# Patient Record
Sex: Female | Born: 1955 | Race: White | Hispanic: No | Smoking: Former smoker
Health system: Southern US, Community
[De-identification: ages and names within clinical notes are randomized; demographics above are authoritative.]

## PROBLEM LIST (undated history)

## (undated) DIAGNOSIS — F419 Anxiety disorder, unspecified: Secondary | ICD-10-CM

## (undated) DIAGNOSIS — M1990 Unspecified osteoarthritis, unspecified site: Secondary | ICD-10-CM

## (undated) DIAGNOSIS — D229 Melanocytic nevi, unspecified: Secondary | ICD-10-CM

## (undated) DIAGNOSIS — G4700 Insomnia, unspecified: Secondary | ICD-10-CM

## (undated) DIAGNOSIS — D099 Carcinoma in situ, unspecified: Secondary | ICD-10-CM

## (undated) DIAGNOSIS — D049 Carcinoma in situ of skin, unspecified: Secondary | ICD-10-CM

## (undated) HISTORY — DX: Unspecified osteoarthritis, unspecified site: M19.90

## (undated) HISTORY — DX: Anxiety disorder, unspecified: F41.9

## (undated) HISTORY — DX: Melanocytic nevi, unspecified: D22.9

## (undated) HISTORY — DX: Insomnia, unspecified: G47.00

## (undated) HISTORY — DX: Carcinoma in situ, unspecified: D09.9

## (undated) HISTORY — PX: TUBAL LIGATION: SHX77

## (undated) HISTORY — DX: Carcinoma in situ of skin, unspecified: D04.9

---

## 2001-01-18 DIAGNOSIS — D049 Carcinoma in situ of skin, unspecified: Secondary | ICD-10-CM

## 2001-01-18 DIAGNOSIS — D229 Melanocytic nevi, unspecified: Secondary | ICD-10-CM

## 2001-01-18 HISTORY — DX: Melanocytic nevi, unspecified: D22.9

## 2001-01-18 HISTORY — DX: Carcinoma in situ of skin, unspecified: D04.9

## 2005-12-21 ENCOUNTER — Other Ambulatory Visit: Admission: RE | Admit: 2005-12-21 | Discharge: 2005-12-21 | Payer: Self-pay | Admitting: Gynecology

## 2007-01-10 ENCOUNTER — Other Ambulatory Visit: Admission: RE | Admit: 2007-01-10 | Discharge: 2007-01-10 | Payer: Self-pay | Admitting: Gynecology

## 2007-01-18 ENCOUNTER — Other Ambulatory Visit: Admission: RE | Admit: 2007-01-18 | Discharge: 2007-01-18 | Payer: Self-pay | Admitting: Gynecology

## 2007-02-06 ENCOUNTER — Other Ambulatory Visit: Admission: RE | Admit: 2007-02-06 | Discharge: 2007-02-06 | Payer: Self-pay | Admitting: Gynecology

## 2008-01-13 ENCOUNTER — Other Ambulatory Visit: Admission: RE | Admit: 2008-01-13 | Discharge: 2008-01-13 | Payer: Self-pay | Admitting: Gynecology

## 2008-01-13 ENCOUNTER — Encounter: Payer: Self-pay | Admitting: Women's Health

## 2008-01-13 ENCOUNTER — Ambulatory Visit: Payer: Self-pay | Admitting: Women's Health

## 2012-02-21 HISTORY — PX: BREAST SURGERY: SHX581

## 2012-08-07 ENCOUNTER — Encounter: Payer: Self-pay | Admitting: General Practice

## 2012-08-07 ENCOUNTER — Ambulatory Visit (INDEPENDENT_AMBULATORY_CARE_PROVIDER_SITE_OTHER): Payer: BC Managed Care – PPO | Admitting: General Practice

## 2012-08-07 VITALS — BP 109/69 | HR 72 | Temp 97.7°F | Ht 68.0 in | Wt 127.0 lb

## 2012-08-07 DIAGNOSIS — N390 Urinary tract infection, site not specified: Secondary | ICD-10-CM

## 2012-08-07 DIAGNOSIS — N760 Acute vaginitis: Secondary | ICD-10-CM

## 2012-08-07 DIAGNOSIS — A499 Bacterial infection, unspecified: Secondary | ICD-10-CM

## 2012-08-07 DIAGNOSIS — B9689 Other specified bacterial agents as the cause of diseases classified elsewhere: Secondary | ICD-10-CM

## 2012-08-07 DIAGNOSIS — N898 Other specified noninflammatory disorders of vagina: Secondary | ICD-10-CM

## 2012-08-07 LAB — POCT URINALYSIS DIPSTICK
Bilirubin, UA: NEGATIVE
Glucose, UA: NEGATIVE
Nitrite, UA: NEGATIVE
Protein, UA: NEGATIVE
Spec Grav, UA: 1.02
Urobilinogen, UA: NEGATIVE
pH, UA: 5

## 2012-08-07 LAB — POCT UA - MICROSCOPIC ONLY
Casts, Ur, LPF, POC: NEGATIVE
Crystals, Ur, HPF, POC: NEGATIVE
Yeast, UA: NEGATIVE

## 2012-08-07 LAB — POCT WET PREP WITH KOH
Bacteria Wet Prep HPF POC: NEGATIVE
Trichomonas, UA: NEGATIVE
Yeast Wet Prep HPF POC: NEGATIVE

## 2012-08-07 MED ORDER — CIPROFLOXACIN HCL 500 MG PO TABS: 500.0000 mg | ORAL_TABLET | Freq: Two times a day (BID) | ORAL | Status: DC

## 2012-08-07 MED ORDER — FLUCONAZOLE 150 MG PO TABS: 150.0000 mg | ORAL_TABLET | Freq: Once | ORAL | Status: DC

## 2012-08-07 NOTE — Patient Instructions (Addendum)
Urinary Tract Infection  Urinary tract infections (UTIs) can develop anywhere along your urinary tract. Your urinary tract is your body's drainage system for removing wastes and extra water. Your urinary tract includes two kidneys, two ureters, a bladder, and a urethra. Your kidneys are a pair of bean-shaped organs. Each kidney is about the size of your fist. They are located below your ribs, one on each side of your spine.  CAUSES  Infections are caused by microbes, which are microscopic organisms, including fungi, viruses, and bacteria. These organisms are so small that they can only be seen through a microscope. Bacteria are the microbes that most commonly cause UTIs.  SYMPTOMS   Symptoms of UTIs may vary by age and gender of the patient and by the location of the infection. Symptoms in young women typically include a frequent and intense urge to urinate and a painful, burning feeling in the bladder or urethra during urination. Older women and men are more likely to be tired, shaky, and weak and have muscle aches and abdominal pain. A fever may mean the infection is in your kidneys. Other symptoms of a kidney infection include pain in your back or sides below the ribs, nausea, and vomiting.  DIAGNOSIS  To diagnose a UTI, your caregiver will ask you about your symptoms. Your caregiver also will ask to provide a urine sample. The urine sample will be tested for bacteria and white blood cells. White blood cells are made by your body to help fight infection.  TREATMENT   Typically, UTIs can be treated with medication. Because most UTIs are caused by a bacterial infection, they usually can be treated with the use of antibiotics. The choice of antibiotic and length of treatment depend on your symptoms and the type of bacteria causing your infection.  HOME CARE INSTRUCTIONS   If you were prescribed antibiotics, take them exactly as your caregiver instructs you. Finish the medication even if you feel better after you  have only taken some of the medication.   Drink enough water and fluids to keep your urine clear or pale yellow.   Avoid caffeine, tea, and carbonated beverages. They tend to irritate your bladder.   Empty your bladder often. Avoid holding urine for long periods of time.   Empty your bladder before and after sexual intercourse.   After a bowel movement, women should cleanse from front to back. Use each tissue only once.  SEEK MEDICAL CARE IF:    You have back pain.   You develop a fever.   Your symptoms do not begin to resolve within 3 days.  SEEK IMMEDIATE MEDICAL CARE IF:    You have severe back pain or lower abdominal pain.   You develop chills.   You have nausea or vomiting.   You have continued burning or discomfort with urination.  MAKE SURE YOU:    Understand these instructions.   Will watch your condition.   Will get help right away if you are not doing well or get worse.  Document Released: 11/30/2004 Document Revised: 08/22/2011 Document Reviewed: 03/31/2011  [employer] Patient Information 2014 [employer].

## 2012-08-07 NOTE — Progress Notes (Signed)
Subjective:    Patient ID: Elizabeth Nash, female    DOB: [birth year], 57 y.o.   MRN: [id number]  Urinary Tract Infection  This is a new problem. The current episode started in the past 7 days. The problem occurs intermittently. The problem has been gradually worsening. The quality of the pain is described as aching and burning. The pain is at a severity of 5/10. There has been no fever. She is sexually active. There is no history of pyelonephritis. Associated symptoms include flank pain, frequency and urgency. Pertinent negatives include no chills, hematuria or nausea. She has tried nothing for the symptoms. Her past medical history is significant for recurrent UTIs.  Reports having a new partner (dating three months) and has noticed a vaginal discharge. Yellowish brown discharge with light "fishy" odor. Reports having unprotected sex.    Review of Systems  Constitutional: Negative for fever and chills.  Respiratory: Negative for cough, chest tightness and shortness of breath.   Cardiovascular: Negative for chest pain and palpitations.  Gastrointestinal: Negative for nausea.  Genitourinary: Positive for urgency, frequency and flank pain. Negative for hematuria.  Neurological: Positive for dizziness. Negative for syncope, weakness and headaches.  All other systems reviewed and are negative.   Results for orders placed in visit on 08/07/12  POCT UA - MICROSCOPIC ONLY      Result Value Range   WBC, Ur, HPF, POC 20-30     RBC, urine, microscopic 50-100     Bacteria, U Microscopic many     Mucus, UA mod     Epithelial cells, urine per micros occ     Crystals, Ur, HPF, POC neg     Casts, Ur, LPF, POC neg     Yeast, UA neg    POCT URINALYSIS DIPSTICK      Result Value Range   Color, UA yellow     Clarity, UA cloudy     Glucose, UA neg     Bilirubin, UA neg     Ketones, UA mod     Spec Grav, UA 1.020     Blood, UA trace     pH, UA 5.0     Protein, UA neg     Urobilinogen, UA negative      Nitrite, UA neg     Leukocytes, UA moderate (2+)    POCT WET PREP WITH KOH      Result Value Range   Trichomonas, UA Negative     Clue Cells Wet Prep HPF POC few     Epithelial Wet Prep HPF POC few     Yeast Wet Prep HPF POC neg     Bacteria Wet Prep HPF POC neg     RBC Wet Prep HPF POC 1-3     WBC Wet Prep HPF POC 20-30        Objective:   Physical Exam  Constitutional: She is oriented to person, place, and time. She appears well-developed and well-nourished.  Cardiovascular: Normal rate, regular rhythm and normal heart sounds.   Pulmonary/Chest: Effort normal and breath sounds normal. No respiratory distress. She exhibits no tenderness.  Abdominal: Soft. Bowel sounds are normal. She exhibits no distension. There is no tenderness.  Neurological: She is alert and oriented to person, place, and time.  Skin: Skin is warm and dry.  Psychiatric: She has a normal mood and affect.        Assessment & Plan:  1. UTI (urinary tract infection) - POCT UA - Microscopic Only - POCT  urinalysis dipstick - ciprofloxacin (CIPRO) 500 MG tablet; Take 1 tablet (500 mg total) by mouth 2 (two) times daily.  Dispense: 20 tablet; Refill: 0  2. Vaginal discharge/Bacterial vaginosis - POCT Wet Prep with KOH -flagyl 500 mg one tablet twice daily for one week -Do not drink alcohol while taking medication   3. Vaginitis and vulvovaginitis, unspecified - fluconazole (DIFLUCAN) 150 MG tablet; Take 1 tablet (150 mg total) by mouth once.  Dispense: 1 tablet; Refill: 0 -Increase fluid intake AZO over the counter X2 days Frequent voiding Proper perineal hygiene RTO prn Culture pending Patient verbalized understanding Coralie Keens, FNP-C

## 2012-08-08 MED ORDER — METRONIDAZOLE 500 MG PO TABS: 500.0000 mg | ORAL_TABLET | Freq: Two times a day (BID) | ORAL | Status: DC

## 2012-08-09 ENCOUNTER — Telehealth: Payer: Self-pay | Admitting: General Practice

## 2012-08-09 NOTE — Telephone Encounter (Signed)
Spoke with patient.

## 2012-12-05 ENCOUNTER — Ambulatory Visit (INDEPENDENT_AMBULATORY_CARE_PROVIDER_SITE_OTHER): Payer: BC Managed Care – PPO | Admitting: Nurse Practitioner

## 2012-12-05 ENCOUNTER — Encounter (INDEPENDENT_AMBULATORY_CARE_PROVIDER_SITE_OTHER): Payer: BC Managed Care – PPO | Admitting: General Practice

## 2012-12-05 ENCOUNTER — Encounter: Payer: Self-pay | Admitting: Family Medicine

## 2012-12-05 VITALS — BP 125/67 | HR 62 | Temp 98.2°F | Ht 68.0 in | Wt 128.0 lb

## 2012-12-05 DIAGNOSIS — K649 Unspecified hemorrhoids: Secondary | ICD-10-CM

## 2012-12-05 DIAGNOSIS — Z029 Encounter for administrative examinations, unspecified: Secondary | ICD-10-CM

## 2012-12-05 DIAGNOSIS — R300 Dysuria: Secondary | ICD-10-CM

## 2012-12-05 DIAGNOSIS — N898 Other specified noninflammatory disorders of vagina: Secondary | ICD-10-CM

## 2012-12-05 LAB — POCT UA - MICROSCOPIC ONLY
Casts, Ur, LPF, POC: NEGATIVE
Crystals, Ur, HPF, POC: NEGATIVE
Mucus, UA: NEGATIVE
RBC, urine, microscopic: NEGATIVE
WBC, Ur, HPF, POC: NEGATIVE
Yeast, UA: NEGATIVE

## 2012-12-05 LAB — POCT WET PREP (WET MOUNT)
KOH Wet Prep POC: NEGATIVE
Trichomonas Wet Prep HPF POC: NEGATIVE

## 2012-12-05 LAB — POCT URINALYSIS DIPSTICK
Bilirubin, UA: NEGATIVE
Blood, UA: NEGATIVE
Glucose, UA: NEGATIVE
Ketones, UA: NEGATIVE
Leukocytes, UA: NEGATIVE
Nitrite, UA: NEGATIVE
Protein, UA: NEGATIVE
Spec Grav, UA: 1.01
Urobilinogen, UA: NEGATIVE
pH, UA: 7.5

## 2012-12-05 MED ORDER — LUBIPROSTONE 24 MCG PO CAPS: 24.0000 ug | ORAL_CAPSULE | Freq: Two times a day (BID) | ORAL | Status: DC

## 2012-12-05 MED ORDER — HYDROCORTISONE 2.5 % RE CREA
TOPICAL_CREAM | Freq: Two times a day (BID) | RECTAL | Status: DC
Start: 2012-12-05 — End: 2013-02-17

## 2012-12-05 NOTE — Patient Instructions (Signed)
Hemorrhoids Hemorrhoids are swollen veins around the rectum or anus. There are two types of hemorrhoids:   Internal hemorrhoids. These occur in the veins just inside the rectum. They may poke through to the outside and become irritated and painful.  External hemorrhoids. These occur in the veins outside the anus and can be felt as a painful swelling or hard lump near the anus. CAUSES  Pregnancy.   Obesity.   Constipation or diarrhea.   Straining to have a bowel movement.   Sitting for long periods on the toilet.  Heavy lifting or other activity that caused you to strain.  Anal intercourse. SYMPTOMS   Pain.   Anal itching or irritation.   Rectal bleeding.   Fecal leakage.   Anal swelling.   One or more lumps around the anus.  DIAGNOSIS  Your caregiver may be able to diagnose hemorrhoids by visual examination. Other examinations or tests that may be performed include:   Examination of the rectal area with a gloved hand (digital rectal exam).   Examination of anal canal using a small tube (scope).   A blood test if you have lost a significant amount of blood.  A test to look inside the colon (sigmoidoscopy or colonoscopy). TREATMENT Most hemorrhoids can be treated at home. However, if symptoms do not seem to be getting better or if you have a lot of rectal bleeding, your caregiver may perform a procedure to help make the hemorrhoids get smaller or remove them completely. Possible treatments include:   Placing a rubber band at the base of the hemorrhoid to cut off the circulation (rubber band ligation).   Injecting a chemical to shrink the hemorrhoid (sclerotherapy).   Using a tool to burn the hemorrhoid (infrared light therapy).   Surgically removing the hemorrhoid (hemorrhoidectomy).   Stapling the hemorrhoid to block blood flow to the tissue (hemorrhoid stapling).  HOME CARE INSTRUCTIONS   Eat foods with fiber, such as whole grains, beans,  nuts, fruits, and vegetables. Ask your doctor about taking products with added fiber in them (fibersupplements).  Increase fluid intake. Drink enough water and fluids to keep your urine clear or pale yellow.   Exercise regularly.   Go to the bathroom when you have the urge to have a bowel movement. Do not wait.   Avoid straining to have bowel movements.   Keep the anal area dry and clean. Use wet toilet paper or moist towelettes after a bowel movement.   Medicated creams and suppositories may be used or applied as directed.   Only take over-the-counter or prescription medicines as directed by your caregiver.   Take warm sitz baths for 15 20 minutes, 3 4 times a day to ease pain and discomfort.   Place ice packs on the hemorrhoids if they are tender and swollen. Using ice packs between sitz baths may be helpful.   Put ice in a plastic bag.   Place a towel between your skin and the bag.   Leave the ice on for 15 20 minutes, 3 4 times a day.   Do not use a donut-shaped pillow or sit on the toilet for long periods. This increases blood pooling and pain.  SEEK MEDICAL CARE IF:  You have increasing pain and swelling that is not controlled by treatment or medicine.  You have uncontrolled bleeding.  You have difficulty or you are unable to have a bowel movement.  You have pain or inflammation outside the area of the hemorrhoids. MAKE SURE YOU:    Understand these instructions.  Will watch your condition.  Will get help right away if you are not doing well or get worse. Document Released: 02/18/2000 Document Revised: 02/07/2012 Document Reviewed: 12/26/2011 [employer] Patient Information 2014 [employer].  

## 2012-12-05 NOTE — Progress Notes (Signed)
Subjective:    Patient ID: Elizabeth Nash, female    DOB: 1955-04-18, 57 y.o.   MRN: [id number]  HPI  Patient has an external hemorrhoid- has had one in the past that had to be lanced-has been using suppository and cream which hasn't helped- warm baths help some. She has a long history of constipation and has only taken stool softners for it. She also says that she thinks she has a UTI- slight dysuria and urgency that started a couple of days ago. * had vaginal infection infection at last visit and wants to make sure it is gone.She does have a vaginal discharge but no odor.   Review of Systems  All other systems reviewed and are negative.       Objective:   Physical Exam  Constitutional: She appears well-developed and well-nourished.  Cardiovascular: Normal rate, regular rhythm and normal heart sounds.   Pulmonary/Chest: Effort normal and breath sounds normal.   BP 125/67  Pulse 62  Temp(Src) 98.2 F (36.8 C) (Oral)  Ht 5\' 8"  (1.727 m)  Wt 128 lb (58.06 kg)  BMI 19.47 kg/m2  Results for orders placed in visit on 12/05/12  POCT UA - MICROSCOPIC ONLY      Result Value Range   WBC, Ur, HPF, POC neg     RBC, urine, microscopic neg     Bacteria, U Microscopic occ     Mucus, UA neg     Epithelial cells, urine per micros occ     Crystals, Ur, HPF, POC neg     Casts, Ur, LPF, POC neg     Yeast, UA neg    POCT URINALYSIS DIPSTICK      Result Value Range   Color, UA gold     Clarity, UA clear     Glucose, UA neg     Bilirubin, UA neg     Ketones, UA neg     Spec Grav, UA 1.010     Blood, UA neg     pH, UA 7.5     Protein, UA neg     Urobilinogen, UA negative     Nitrite, UA neg     Leukocytes, UA Negative    POCT WET PREP (WET MOUNT)      Result Value Range   Source Wet Prep POC       WBC, Wet Prep HPF POC 20-40     Bacteria Wet Prep HPF POC moderate     Clue Cells Wet Prep HPF POC None     Yeast Wet Prep HPF POC None     KOH Wet Prep POC negative     Trichomonas Wet  Prep HPF POC negative     Procedure: Lanced hemorrhoid Lido !% with epi-46ml Betadine prep #11 blade- Thrombus removed from hemorrhoid      Assessment & Plan:   1. Burning with urination   2. Vaginal discharge   3. Hemorrhoid   4 .constipation Orders Placed This Encounter  Procedures  . POCT UA - Microscopic Only  . POCT urinalysis dipstick  . POCT Wet Prep [facility])   Meds ordered this encounter  Medications  . lubiprostone (AMITIZA) 24 MCG capsule    Sig: Take 1 capsule (24 mcg total) by mouth 2 (two) times daily with a meal.    Dispense:  16 capsule    Refill:  0    Order Specific Question:  Supervising Provider    Answer:  Ernestina Penna [[id number]]  . hydrocortisone (  ANUSOL-HC) 2.5 % rectal cream    Sig: Place rectally 2 (two) times daily.    Dispense:  30 g    Refill:  2    Order Specific Question:  Supervising Provider    Answer:  Deborra Medina    Force fluids increase fiber in diet RTO prn Patient will let e know if [place] doesn't work- will try [employer], FNP

## 2012-12-06 NOTE — Progress Notes (Signed)
Patient ID: Elizabeth Nash, female   DOB: Nov 11, 1955, 57 y.o.   MRN: [id number]  No Show

## 2012-12-09 ENCOUNTER — Telehealth: Payer: Self-pay | Admitting: Nurse Practitioner

## 2012-12-09 NOTE — Telephone Encounter (Signed)
Are you talking about hemorrhoid?

## 2012-12-10 ENCOUNTER — Other Ambulatory Visit: Payer: Self-pay | Admitting: Nurse Practitioner

## 2012-12-10 ENCOUNTER — Telehealth: Payer: Self-pay

## 2012-12-10 DIAGNOSIS — K649 Unspecified hemorrhoids: Secondary | ICD-10-CM

## 2012-12-10 MED ORDER — HYDROCORTISONE ACETATE 25 MG RE SUPP: 25.0000 mg | Freq: Two times a day (BID) | RECTAL | Status: DC

## 2012-12-10 NOTE — Telephone Encounter (Signed)
Yes it is for hemorrhoid. It has came back since you lanced it and is bigger. Wants to see someone asap. Also wants suppositories sent in to [facility] in [place]. They are working better than the cream. Please advise

## 2012-12-10 NOTE — Telephone Encounter (Signed)
Need a referral to GI for hemorrhoids

## 2012-12-10 NOTE — Telephone Encounter (Signed)
No does not care

## 2012-12-10 NOTE — Telephone Encounter (Signed)
Suppository rx sent to pharmacy

## 2012-12-10 NOTE — Telephone Encounter (Signed)
Don't go to GI for hemorrhoids- you have to see general surgeon- do you have a particular person want to see.

## 2012-12-11 ENCOUNTER — Other Ambulatory Visit: Payer: Self-pay | Admitting: Nurse Practitioner

## 2012-12-12 NOTE — Telephone Encounter (Signed)
Last seen 08/07/12  Mae

## 2012-12-18 ENCOUNTER — Ambulatory Visit (INDEPENDENT_AMBULATORY_CARE_PROVIDER_SITE_OTHER): Payer: BC Managed Care – PPO | Admitting: General Surgery

## 2012-12-18 ENCOUNTER — Encounter (INDEPENDENT_AMBULATORY_CARE_PROVIDER_SITE_OTHER): Payer: Self-pay | Admitting: General Surgery

## 2012-12-18 VITALS — BP 120/72 | HR 72 | Resp 12 | Ht 67.0 in | Wt 128.0 lb

## 2012-12-18 DIAGNOSIS — K645 Perianal venous thrombosis: Secondary | ICD-10-CM

## 2012-12-18 DIAGNOSIS — K648 Other hemorrhoids: Secondary | ICD-10-CM

## 2012-12-18 DIAGNOSIS — K649 Unspecified hemorrhoids: Secondary | ICD-10-CM | POA: Insufficient documentation

## 2012-12-18 NOTE — Patient Instructions (Signed)
Fiber Chart  You should 25-30g of fiber per day and drinking 8 glasses of water to help your bowels move regularly.  In the chart below you can look up how much fiber you are getting in an average day.  If you are not getting enough fiber, you should add a fiber supplement to your diet.  Examples of this include Metamucil, FiberCon and Citrucel.  These can be purchased at your local grocery store or pharmacy.      http://www.[employer].edu/offices/health/nutritioncoach/AtoZ/handouts/Fiber.pdf   GETTING TO GOOD BOWEL HEALTH. Irregular bowel habits such as constipation can lead to many problems over time.  Having one soft bowel movement a day is the most important way to prevent further problems.  The anorectal canal is designed to handle stretching and feces to safely manage our ability to get rid of solid waste (feces, poop, stool) out of our body.  BUT, hard constipated stools can act like ripping concrete bricks causing inflamed hemorrhoids, anal fissures, abdominal pain and bloating.     The goal: ONE SOFT BOWEL MOVEMENT A DAY!  To have soft, regular bowel movements:    Drink at least 8 tall glasses of water a day.     Take plenty of fiber.  Fiber is the undigested part of plant food that passes into the colon, acting s "natures broom" to encourage bowel motility and movement.  Fiber can absorb and hold large amounts of water. This results in a larger, bulkier stool, which is soft and easier to pass. Work gradually over several weeks up to 6 servings a day of fiber (25g a day even more if needed) in the form of: o Vegetables -- Root (potatoes, carrots, turnips), leafy green (lettuce, salad greens, celery, spinach), or cooked high residue (cabbage, broccoli, etc) o Fruit -- Fresh (unpeeled skin & pulp), Dried (prunes, apricots, cherries, etc ),  or stewed ( applesauce)  o Whole grain breads, pasta, etc (whole wheat)  o Bran cereals    Bulking Agents -- This type of water-retaining fiber generally is  easily obtained each day by one of the following:  o Psyllium bran -- The psyllium plant is remarkable because its ground seeds can retain so much water. This product is available as Metamucil, Konsyl, Effersyllium, Per Diem Fiber, or the less expensive generic preparation in drug and health food stores. Although labeled a laxative, it really is not a laxative.  o Methylcellulose -- This is another fiber derived from wood which also retains water. It is available as Citrucel. o Polyethylene Glycol - and "artificial" fiber commonly called Miralax or Glycolax.  It is helpful for people with gassy or bloated feelings with regular fiber o Flax Seed - a less gassy fiber than psyllium   No reading or other relaxing activity while on the toilet. If bowel movements take longer than 5 minutes, you are too constipated   AVOID CONSTIPATION.  High fiber and water intake usually takes care of this.  Sometimes a laxative is needed to stimulate more frequent bowel movements, but    Laxatives are not a good long-term solution as it can wear the colon out. o Osmotics (Milk of Magnesia, Fleets phosphosoda, Magnesium citrate, MiraLax, GoLytely) are safer than  o Stimulants (Senokot, Castor Oil, Dulcolax, Ex Lax)    o Do not take laxatives for more than 7days in a row.    IF SEVERELY CONSTIPATED, try a Bowel Retraining Program: o Do not use laxatives.  o Eat a diet high in roughage, such as   bran cereals and leafy vegetables.  o Drink six (6) ounces of prune or apricot juice each morning.  o Eat two (2) large servings of stewed fruit each day.  o Take one (1) heaping tablespoon of a psyllium-based bulking agent twice a day. Use sugar-free sweetener when possible to avoid excessive calories.  o Eat a normal breakfast.  o Set aside 15 minutes after breakfast to sit on the toilet, but do not strain to have a bowel movement.  o If you do not have a bowel movement by the third day, use an enema and repeat the above steps.     HEMORRHOIDS    Did you know... Hemorrhoids are one of the most common ailments known.  More than half the population will develop hemorrhoids, usually after age 30.  Millions of Americans currently suffer from hemorrhoids.  The average person suffers in silence for a long period before seeking medical care.  Today's treatment methods make some types of hemorrhoid removal much less painful.  What are hemorrhoids? Often described as "varicose veins of the anus and rectum", hemorrhoids are enlarged, bulging blood vessels in and about the anus and lower rectum. There are two types of hemorrhoids: external and internal, which refer to their location.  External (outside) hemorrhoids develop near the anus and are covered by very sensitive skin. These are usually painless. However, if a blood clot (thrombosis) develops in an external hemorrhoid, it becomes a painful, hard lump. The external hemorrhoid may bleed if it ruptures. Internal (inside) hemorrhoids develop within the anus beneath the lining. Painless bleeding and protrusion during bowel movements are the most common symptom. However, an internal hemorrhoid can cause severe pain if it is completely "prolapsed" - protrudes from the anal opening and cannot be pushed back inside.   What causes hemorrhoids? An exact cause is unknown; however, the upright posture of humans alone forces a great deal of pressure on the rectal veins, which sometimes causes them to bulge. Other contributing factors include:  . Aging  . Chronic constipation or diarrhea  . Pregnancy  . Heredity  . Straining during bowel movements  . Faulty bowel function due to overuse of laxatives or enemas . Spending long periods of time (e.g., reading) on the toilet  Whatever the cause, the tissues supporting the vessels stretch. As a result, the vessels dilate; their walls become thin and bleed. If the stretching and pressure continue, the weakened vessels protrude.  What are  the symptoms? If you notice any of the following, you could have hemorrhoids:  . Bleeding during bowel movements  . Protrusion during bowel movements . Itching in the anal area  . Pain  . Sensitive lump(s)  How are hemorrhoids treated? Mild symptoms can be relieved frequently by increasing the amount of fiber (e.g., fruits, vegetables, breads and cereals) and fluids in the diet. Eliminating excessive straining reduces the pressure on hemorrhoids and helps prevent them from protruding. A sitz bath - sitting in plain warm water for about 10 minutes - can also provide some relief . With these measures, the pain and swelling of most symptomatic hemorrhoids will decrease in two to seven days, and the firm lump should recede within four to six weeks. In cases of severe or persistent pain from a thrombosed hemorrhoid, your physician may elect to remove the hemorrhoid containing the clot with a small incision. Performed under local anesthesia as an outpatient, this procedure generally provides relief. Severe hemorrhoids may require special treatment, much of which can   be performed on an outpatient basis.  . Ligation - the rubber band treatment - works effectively on internal hemorrhoids that protrude with bowel movements. A small rubber band is placed over the hemorrhoid, cutting off its blood supply. The hemorrhoid and the band fall off in a few days and the wound usually heals in a week or two. This procedure sometimes produces mild discomfort and bleeding and may need to be repeated for a full effect.  There is a more intense version of this procedure that is done in the OR as outpatient surgery called THD.  It involves identifying blood vessels leading to the hemorrhoids and then tying them off with sutures.  This method is a little more painful than rubber band ligation but less painful than traditional hemorrhoidectomy and usually does not have to be repeated.  It is best for internal hemorrhoids that  bleed.  Rubber Band Ligation of Internal Hemorrhoids:  A.  Bulging, bleeding, internal hemorrhoid B.  Rubber band applied at the base of the hemorrhoid C.  About 7 days later, the banded hemorrhoid has fallen off leaving a small scar (arrow)  . Injection and Coagulation can also be used on bleeding hemorrhoids that do not protrude. Both methods are relatively painless and cause the hemorrhoid to shrivel up. . Hemorrhoidectomy - surgery to remove the hemorrhoids - is the most complete method for removal of internal and external hemorrhoids. It is necessary when (1) clots repeatedly form in external hemorrhoids; (2) ligation fails to treat internal hemorrhoids; (3) the protruding hemorrhoid cannot be reduced; or (4) there is persistent bleeding. A hemorrhoidectomy removes excessive tissue that causes the bleeding and protrusion. It is done under anesthesia using sutures, and may, depending upon circumstances, require hospitalization and a period of inactivity. Laser hemorrhoidectomies do not offer any advantage over standard operative techniques. They are also quite expensive, and contrary to popular belief, are no less painful.  Do hemorrhoids lead to cancer? No. There is no relationship between hemorrhoids and cancer. However, the symptoms of hemorrhoids, particularly bleeding, are similar to those of colorectal cancer and other diseases of the digestive system. Therefore, it is important that all symptoms are investigated by a physician specially trained in treating diseases of the colon and rectum and that everyone 50 years or older undergo screening tests for colorectal cancer. Do not rely on over-the-counter medications or other self-treatments. See a colorectal surgeon first so your symptoms can be properly evaluated and effective treatment prescribed.  2012 American Society of Colon & Rectal Surgeons     

## 2012-12-18 NOTE — Progress Notes (Signed)
Chief Complaint  Patient presents with  . Hemorrhoids    HISTORY: Elizabeth Nash is a 57 y.o. female who presents to the office with anal pain.  Other symptoms include occasional blood on toilet paper.  This had been occurring for about a year.  She has tried anusol and sitz baths in the past with some success.  Constipation and straining makes the symptoms worse.   It is intermittent in nature.  Her bowel habits are regular and her bowel movements are soft on stool softeners and amitiza.  Her fiber intake is dietary.  Her last colonoscopy was last year and clear per pt.  She denies prolapsing tissue.  She is s/p lancing x2 for an external thrombosis.  She has been doing a lot of heavy lifting at work.  She has a long standing h/o constipation.     Past Medical History  Diagnosis Date  . Anxiety   . Insomnia   . Arthritis       Past Surgical History  Procedure Laterality Date  . Cesarean section    . Tubal ligation    . Breast surgery Bilateral 02-21-2012    L-300cc,R-325cc        Current Outpatient Prescriptions  Medication Sig Dispense Refill  . Calcium Citrate (CITRACAL PO) Take by mouth.      Tery Sanfilippo Calcium (STOOL SOFTENER PO) Take by mouth.      . etodolac (LODINE) 400 MG tablet Take 400 mg by mouth 2 (two) times daily.      Marland Kitchen HEMRIL-30 30 MG SUPP USE 1 SUPPOSITORY RECTALLY EVERY 6 HOURS AS NEEDED FOR HEMORRHOIDS  30 suppository  0  . hydrocortisone (ANUSOL-HC) 2.5 % rectal cream Place rectally 2 (two) times daily.  30 g  2  . hydrocortisone (ANUSOL-HC) 25 MG suppository Place 1 suppository (25 mg total) rectally 2 (two) times daily.  12 suppository  2  . lubiprostone (AMITIZA) 24 MCG capsule Take 1 capsule (24 mcg total) by mouth 2 (two) times daily with a meal.  16 capsule  0   No current facility-administered medications for this visit.      Allergies  Allergen Reactions  . Codeine   . Penicillins   . Sulfa Antibiotics       Family History  Problem Relation Age of  Onset  . Hypertension Mother   . Hypothyroidism Mother   . Cancer Father     ESOPHAGEAL AND BRAIN    History   Social History  . Marital Status: Divorced    Spouse Name: N/A    Number of Children: N/A  . Years of Education: N/A   Social History Main Topics  . Smoking status: Former Smoker    Quit date: 12/19/1987  . Smokeless tobacco: None  . Alcohol Use: Yes     Comment: rare  . Drug Use: No  . Sexual Activity: None   Other Topics Concern  . None   Social History Narrative  . None      REVIEW OF SYSTEMS - PERTINENT POSITIVES ONLY: Review of Systems - General ROS: negative for - chills, fever or weight loss Hematological and Lymphatic ROS: negative for - bleeding problems, blood clots or bruising Respiratory ROS: no cough, shortness of breath, or wheezing Cardiovascular ROS: no chest pain or dyspnea on exertion Gastrointestinal ROS: no abdominal pain, change in bowel habits, or black or bloody stools Genito-Urinary ROS: no dysuria, trouble voiding, or hematuria  EXAM: Filed Vitals:   12/18/12 1524  BP:  120/72  Pulse: 72  Resp: 12    General appearance: alert and cooperative Resp: clear to auscultation bilaterally Cardio: regular rate and rhythm GI: normal findings: soft, non-tender   Procedure: Anoscopy Surgeon: Maisie Fus Diagnosis: anal pain  Assistant: Christella Scheuermann After the risks and benefits were explained, verbal consent was obtained for above procedure  Anesthesia: none Findings: thrombosed R posterior internal hemorrhoids, grade 2 R ant and L lat int hemorrhoids, mild skin tags with external thrombosis in L lateral hemorrhoid that appears to be resolving, no fissure    ASSESSMENT AND PLAN:  Elizabeth Nash is a 57 y.o. F with a long-standing h/o constipation and recent thrombosed external hemorrhoids.  She is here to discuss treatment.  We discussed the need for a fiber supplement to get to 25-30g of fiber a day.  It sounds like she is drinking enough  fluids.  She can use the anusol and sitz baths for comfort.  I have asked her to avoiding straining and heavy lifting.  I will see her back in 2 months to check on her progression.     Vanita Panda, MD Colon and Rectal Surgery / General Surgery [facility]      Visit Diagnoses: 1. Internal and external thrombosed hemorrhoids     Primary Care Physician: Bennie Pierini, FNP

## 2012-12-19 ENCOUNTER — Telehealth: Payer: Self-pay | Admitting: Nurse Practitioner

## 2012-12-19 MED ORDER — LUBIPROSTONE 24 MCG PO CAPS: 24.0000 ug | ORAL_CAPSULE | Freq: Two times a day (BID) | ORAL | Status: DC

## 2012-12-19 NOTE — Telephone Encounter (Signed)
rx ready for pickup 

## 2012-12-19 NOTE — Telephone Encounter (Signed)
Left message, rx ready. 

## 2013-01-14 ENCOUNTER — Encounter: Payer: Self-pay | Admitting: Family Medicine

## 2013-01-14 ENCOUNTER — Ambulatory Visit (INDEPENDENT_AMBULATORY_CARE_PROVIDER_SITE_OTHER): Payer: BC Managed Care – PPO | Admitting: Family Medicine

## 2013-01-14 VITALS — BP 105/69 | HR 70 | Temp 97.5°F | Ht 68.0 in | Wt 132.0 lb

## 2013-01-14 DIAGNOSIS — L0291 Cutaneous abscess, unspecified: Secondary | ICD-10-CM

## 2013-01-14 DIAGNOSIS — L0390 Cellulitis, unspecified: Secondary | ICD-10-CM

## 2013-01-14 LAB — POCT CBC
Granulocyte percent: 61.5 %G (ref 37–80)
HCT, POC: 40.4 % (ref 37.7–47.9)
Hemoglobin: 13.2 g/dL (ref 12.2–16.2)
Lymph, poc: 1.9 (ref 0.6–3.4)
MCH, POC: 30.3 pg (ref 27–31.2)
MCHC: 32.7 g/dL (ref 31.8–35.4)
MCV: 92.4 fL (ref 80–97)
MPV: 8 fL (ref 0–99.8)
POC Granulocyte: 3.6 (ref 2–6.9)
POC LYMPH PERCENT: 32.7 %L (ref 10–50)
Platelet Count, POC: 192 10*3/uL (ref 142–424)
RBC: 4.4 M/uL (ref 4.04–5.48)
RDW, POC: 13.1 %
WBC: 5.9 10*3/uL (ref 4.6–10.2)

## 2013-01-14 MED ORDER — METRONIDAZOLE 500 MG PO TABS: 500.0000 mg | ORAL_TABLET | Freq: Three times a day (TID) | ORAL | Status: DC

## 2013-01-14 MED ORDER — DOXYCYCLINE HYCLATE 100 MG PO CAPS: 100.0000 mg | ORAL_CAPSULE | Freq: Two times a day (BID) | ORAL | Status: DC

## 2013-01-14 NOTE — Patient Instructions (Signed)
Cellulitis Cellulitis is an infection of the skin and the tissue beneath it. The infected area is usually red and tender. Cellulitis occurs most often in the arms and lower legs.  CAUSES  Cellulitis is caused by bacteria that enter the skin through cracks or cuts in the skin. The most common types of bacteria that cause cellulitis are Staphylococcus and Streptococcus. SYMPTOMS   Redness and warmth.  Swelling.  Tenderness or pain.  Fever. DIAGNOSIS  Your caregiver can usually determine what is wrong based on a physical exam. Blood tests may also be done. TREATMENT  Treatment usually involves taking an antibiotic medicine. HOME CARE INSTRUCTIONS   Take your antibiotics as directed. Finish them even if you start to feel better.  Keep the infected arm or leg elevated to reduce swelling.  Apply a warm cloth to the affected area up to 4 times per day to relieve pain.  Only take over-the-counter or prescription medicines for pain, discomfort, or fever as directed by your caregiver.  Keep all follow-up appointments as directed by your caregiver. SEEK MEDICAL CARE IF:   You notice red streaks coming from the infected area.  Your red area gets larger or turns dark in color.  Your bone or joint underneath the infected area becomes painful after the skin has healed.  Your infection returns in the same area or another area.  You notice a swollen bump in the infected area.  You develop new symptoms. SEEK IMMEDIATE MEDICAL CARE IF:   You have a fever.  You feel very sleepy.  You develop vomiting or diarrhea.  You have a general ill feeling (malaise) with muscle aches and pains. MAKE SURE YOU:   Understand these instructions.  Will watch your condition.  Will get help right away if you are not doing well or get worse. Document Released: 11/30/2004 Document Revised: 08/22/2011 Document Reviewed: 05/08/2011 Exit[employer] Patient Information 2014 [employer].  

## 2013-01-14 NOTE — Progress Notes (Signed)
  Subjective:    Patient ID: Elizabeth Nash, female    DOB: 10/26/1955, 57 y.o.   MRN: [id number]  HPI Pt presents today with R 1st finger cellulitis.  Pt works at [employer].  Pt states that she got a briar stuck in finger 2 weeks ago.  Pt states that she has been keeping area clean over the last 2 weeks.  Pt had briar removed at work and has had progressive redness and swelling over the last 2-3 days.  No fevers or chills.  Nondiabetic.  Non smoker.  No numbness or paresthesias.      Review of Systems  All other systems reviewed and are negative.       Objective:   Physical Exam  Constitutional: She appears well-developed and well-nourished.  HENT:  Head: Normocephalic and atraumatic.  Eyes: Conjunctivae are normal. Pupils are equal, round, and reactive to light.  Neck: Normal range of motion.  Cardiovascular: Normal rate and regular rhythm.   Pulmonary/Chest: Effort normal.  Abdominal: Soft.  Musculoskeletal:       Hands: R index finger distal redness at swelling consistent with cellulitis  Full DIP flexion Neurovascularly intact    Neurological: She is alert.  Skin: Skin is warm.   Lab Results  Component Value Date   WBC 5.9 01/14/2013   HGB 13.2 01/14/2013   HCT 40.4 01/14/2013   MCV 92.4 01/14/2013     Procedure: Incision and Drainage Overall risk and benefits were discussed with patient. Patient agreed to procedure. Area cleansed in usual sterile fashion with Betadine. 2% lidocaine without epinephrine used for digital block as well as local infiltration. About 2-3 cc used. Area incised with #11 blade. Small amount of purulent fluid expressed. This was sent for culture. Topical antibiotic ointment placed. Area wrapped with gauze and coban.    Assessment & Plan:  Cellulitis - Plan: POCT CBC, doxycycline (VIBRAMYCIN) 100 MG capsule, metroNIDAZOLE (FLAGYL) 500 MG tablet  IND performed at bedside. Culture sent. We'll place on doxycycline and Flagyl for soft tissue  coverage in the setting of penicillin allergy. Discussed imaging to rule out any type of joint involvement though clinically this seems less likely. White blood cell count of 5.9 today. Patient refused imaging. Plan for followup with orthopedics in the morning for reevaluation. Patient is agreeable to this. Discussed general care and infectious red flags. Cellulitis handout given. Followup as needed.

## 2013-01-14 NOTE — Addendum Note (Signed)
Addended by: Orma Render F on: 01/14/2013 05:16 PM   Modules accepted: Orders

## 2013-01-15 ENCOUNTER — Telehealth: Payer: Self-pay | Admitting: Family Medicine

## 2013-01-15 NOTE — Addendum Note (Signed)
Addended by: Gwenith Daily on: 01/15/2013 02:28 PM   Modules accepted: Orders

## 2013-01-16 ENCOUNTER — Encounter (HOSPITAL_BASED_OUTPATIENT_CLINIC_OR_DEPARTMENT_OTHER)
Admission: RE | Disposition: A | Discharge status: Home / Self Care | Payer: Self-pay | Source: Ambulatory Visit | Attending: Orthopedic Surgery

## 2013-01-16 ENCOUNTER — Encounter (HOSPITAL_BASED_OUTPATIENT_CLINIC_OR_DEPARTMENT_OTHER): Payer: Self-pay | Admitting: *Deleted

## 2013-01-16 ENCOUNTER — Ambulatory Visit (HOSPITAL_BASED_OUTPATIENT_CLINIC_OR_DEPARTMENT_OTHER)
Admission: RE | Admit: 2013-01-16 | Discharge: 2013-01-16 | Disposition: A | Discharge status: Home / Self Care | Payer: BC Managed Care – PPO | Source: Ambulatory Visit | Attending: Orthopedic Surgery | Admitting: Orthopedic Surgery

## 2013-01-16 ENCOUNTER — Other Ambulatory Visit: Payer: Self-pay | Admitting: Orthopedic Surgery

## 2013-01-16 ENCOUNTER — Encounter: Payer: Self-pay | Admitting: Family Medicine

## 2013-01-16 ENCOUNTER — Encounter (HOSPITAL_BASED_OUTPATIENT_CLINIC_OR_DEPARTMENT_OTHER): Payer: BC Managed Care – PPO | Admitting: Anesthesiology

## 2013-01-16 ENCOUNTER — Ambulatory Visit (HOSPITAL_BASED_OUTPATIENT_CLINIC_OR_DEPARTMENT_OTHER): Payer: BC Managed Care – PPO | Admitting: Anesthesiology

## 2013-01-16 DIAGNOSIS — Z87891 Personal history of nicotine dependence: Secondary | ICD-10-CM | POA: Insufficient documentation

## 2013-01-16 DIAGNOSIS — M12549 Traumatic arthropathy, unspecified hand: Secondary | ICD-10-CM | POA: Insufficient documentation

## 2013-01-16 HISTORY — PX: INCISION AND DRAINAGE: SHX5863

## 2013-01-16 LAB — POCT HEMOGLOBIN-HEMACUE: Hemoglobin: 15.1 g/dL — ABNORMAL HIGH (ref 12.0–15.0)

## 2013-01-16 LAB — AEROBIC CULTURE

## 2013-01-16 SURGERY — INCISION AND DRAINAGE
Anesthesia: Monitor Anesthesia Care | Site: Finger | Laterality: Right | Wound class: Dirty or Infected

## 2013-01-16 MED ORDER — ONDANSETRON HCL 4 MG/2ML IJ SOLN
INTRAMUSCULAR | Status: DC | PRN
  Administered 2013-01-16: 4 mg via INTRAVENOUS

## 2013-01-16 MED ORDER — PROPOFOL INFUSION 10 MG/ML OPTIME
INTRAVENOUS | Status: DC | PRN
  Administered 2013-01-16: 50 ug/kg/min via INTRAVENOUS

## 2013-01-16 MED ORDER — LIDOCAINE-EPINEPHRINE (PF) 1.5 %-1:200000 IJ SOLN
INTRAMUSCULAR | Status: DC | PRN
  Administered 2013-01-16: 30 mg

## 2013-01-16 MED ORDER — OXYCODONE HCL 5 MG PO TABS: 5.0000 mg | ORAL_TABLET | Freq: Once | ORAL | Status: DC | PRN

## 2013-01-16 MED ORDER — FENTANYL CITRATE 0.05 MG/ML IJ SOLN
INTRAMUSCULAR | Status: AC
  Filled 2013-01-16: qty 2

## 2013-01-16 MED ORDER — LACTATED RINGERS IV SOLN
INTRAVENOUS | Status: DC
  Administered 2013-01-16: 14:00:00 via INTRAVENOUS

## 2013-01-16 MED ORDER — MIDAZOLAM HCL 2 MG/2ML IJ SOLN
INTRAMUSCULAR | Status: AC
  Filled 2013-01-16: qty 2

## 2013-01-16 MED ORDER — MIDAZOLAM HCL 2 MG/2ML IJ SOLN
1.0000 mg | INTRAMUSCULAR | Status: DC | PRN
  Administered 2013-01-16: 2 mg via INTRAVENOUS

## 2013-01-16 MED ORDER — HYDROMORPHONE HCL PF 1 MG/ML IJ SOLN: 0.2500 mg | INTRAMUSCULAR | Status: DC | PRN

## 2013-01-16 MED ORDER — OXYCODONE-ACETAMINOPHEN 5-325 MG PO TABS: ORAL_TABLET | ORAL | Status: DC

## 2013-01-16 MED ORDER — FENTANYL CITRATE 0.05 MG/ML IJ SOLN
50.0000 ug | Freq: Once | INTRAMUSCULAR | Status: AC
  Administered 2013-01-16: 100 ug via INTRAVENOUS

## 2013-01-16 MED ORDER — FENTANYL CITRATE 0.05 MG/ML IJ SOLN
INTRAMUSCULAR | Status: DC | PRN
  Administered 2013-01-16: 50 ug via INTRAVENOUS

## 2013-01-16 MED ORDER — DEXAMETHASONE SODIUM PHOSPHATE 10 MG/ML IJ SOLN
INTRAMUSCULAR | Status: DC | PRN
  Administered 2013-01-16: 10 mg via INTRAVENOUS

## 2013-01-16 MED ORDER — VANCOMYCIN HCL 1000 MG IV SOLR
1000.0000 mg | INTRAVENOUS | Status: DC | PRN
  Administered 2013-01-16: 1000 mg via INTRAVENOUS

## 2013-01-16 MED ORDER — OXYCODONE HCL 5 MG/5ML PO SOLN: 5.0000 mg | Freq: Once | ORAL | Status: DC | PRN

## 2013-01-16 MED ORDER — METOCLOPRAMIDE HCL 5 MG/ML IJ SOLN: 10.0000 mg | Freq: Once | INTRAMUSCULAR | Status: DC | PRN

## 2013-01-16 MED ORDER — VANCOMYCIN HCL IN DEXTROSE 1-5 GM/200ML-% IV SOLN
INTRAVENOUS | Status: AC
  Filled 2013-01-16: qty 200

## 2013-01-16 MED ORDER — MIDAZOLAM HCL 5 MG/5ML IJ SOLN
INTRAMUSCULAR | Status: DC | PRN
  Administered 2013-01-16: 1 mg via INTRAVENOUS

## 2013-01-16 SURGICAL SUPPLY — 51 items
BAG DECANTER FOR FLEXI CONT (MISCELLANEOUS) IMPLANT
BANDAGE ELASTIC 3 VELCRO ST LF (GAUZE/BANDAGES/DRESSINGS) IMPLANT
BANDAGE GAUZE ELAST BULKY 4 IN (GAUZE/BANDAGES/DRESSINGS) IMPLANT
BANDAGE GAUZE STRT 1 STR LF (GAUZE/BANDAGES/DRESSINGS) IMPLANT
BLADE MINI RND TIP GREEN BEAV (BLADE) IMPLANT
BLADE SURG 15 STRL LF DISP TIS (BLADE) ×2 IMPLANT
BLADE SURG 15 STRL SS (BLADE) ×4
BNDG CMPR 9X4 STRL LF SNTH (GAUZE/BANDAGES/DRESSINGS) ×1
BNDG CMPR MD 5X2 ELC HKLP STRL (GAUZE/BANDAGES/DRESSINGS)
BNDG COHESIVE 1X5 TAN STRL LF (GAUZE/BANDAGES/DRESSINGS) ×1 IMPLANT
BNDG ELASTIC 2 VLCR STRL LF (GAUZE/BANDAGES/DRESSINGS) IMPLANT
BNDG ESMARK 4X9 LF (GAUZE/BANDAGES/DRESSINGS) ×1 IMPLANT
CHLORAPREP W/TINT 26ML (MISCELLANEOUS) ×2 IMPLANT
CORDS BIPOLAR (ELECTRODE) ×2 IMPLANT
COVER MAYO STAND STRL (DRAPES) ×2 IMPLANT
COVER TABLE BACK 60X90 (DRAPES) ×2 IMPLANT
CUFF TOURNIQUET SINGLE 18IN (TOURNIQUET CUFF) ×2 IMPLANT
DRAPE EXTREMITY T 121X128X90 (DRAPE) ×2 IMPLANT
DRAPE SURG 17X23 STRL (DRAPES) ×2 IMPLANT
GAUZE PACKING IODOFORM 1/4X5 (PACKING) IMPLANT
GAUZE XEROFORM 1X8 LF (GAUZE/BANDAGES/DRESSINGS) ×2 IMPLANT
GLOVE BIO SURGEON STRL SZ7.5 (GLOVE) ×2 IMPLANT
GLOVE BIOGEL M STRL SZ7.5 (GLOVE) ×1 IMPLANT
GLOVE BIOGEL PI IND STRL 8 (GLOVE) ×1 IMPLANT
GLOVE BIOGEL PI INDICATOR 8 (GLOVE) ×2
GOWN BRE IMP PREV XXLGXLNG (GOWN DISPOSABLE) ×1 IMPLANT
GOWN PREVENTION PLUS XLARGE (GOWN DISPOSABLE) ×2 IMPLANT
GOWN PREVENTION PLUS XXLARGE (GOWN DISPOSABLE) ×1 IMPLANT
LOOP VESSEL MAXI BLUE (MISCELLANEOUS) ×1 IMPLANT
NDL HYPO 25X1 1.5 SAFETY (NEEDLE) IMPLANT
NEEDLE HYPO 25X1 1.5 SAFETY (NEEDLE) IMPLANT
NS IRRIG 1000ML POUR BTL (IV SOLUTION) ×2 IMPLANT
PACK BASIN DAY SURGERY FS (CUSTOM PROCEDURE TRAY) ×2 IMPLANT
PAD CAST 3X4 CTTN HI CHSV (CAST SUPPLIES) IMPLANT
PADDING CAST ABS 4INX4YD NS (CAST SUPPLIES) ×1
PADDING CAST ABS COTTON 4X4 ST (CAST SUPPLIES) ×1 IMPLANT
PADDING CAST COTTON 3X4 STRL (CAST SUPPLIES)
SPLINT FINGER 5/8X3.25 (SOFTGOODS) IMPLANT
SPLINT FINGER FOAM 3 9119 05 (SOFTGOODS) ×2
SPLINT PLASTER CAST XFAST 3X15 (CAST SUPPLIES) IMPLANT
SPLINT PLASTER XTRA FASTSET 3X (CAST SUPPLIES)
SPONGE GAUZE 4X4 12PLY (GAUZE/BANDAGES/DRESSINGS) ×2 IMPLANT
STOCKINETTE 4X48 STRL (DRAPES) ×2 IMPLANT
SUT ETHILON 4 0 PS 2 18 (SUTURE) IMPLANT
SWAB COLLECTION DEVICE MRSA (MISCELLANEOUS) ×1 IMPLANT
SYR BULB 3OZ (MISCELLANEOUS) ×2 IMPLANT
SYR CONTROL 10ML LL (SYRINGE) ×1 IMPLANT
TOWEL OR 17X24 6PK STRL BLUE (TOWEL DISPOSABLE) ×4 IMPLANT
TUBE ANAEROBIC SPECIMEN COL (MISCELLANEOUS) ×1 IMPLANT
TUBE FEEDING 5FR 15 INCH (TUBING) IMPLANT
UNDERPAD 30X30 INCONTINENT (UNDERPADS AND DIAPERS) ×2 IMPLANT

## 2013-01-16 NOTE — Anesthesia Procedure Notes (Signed)
Anesthesia Regional Block:  Supraclavicular block  Pre-Anesthetic Checklist: ,, timeout performed, Correct Patient, Correct Site, Correct Laterality, Correct Procedure, Correct Position, site marked, Risks and benefits discussed,  Surgical consent,  Pre-op evaluation,  At surgeon's request and post-op pain management  Laterality: Right  Prep: chloraprep       Needles:   Needle Type: Other     Needle Length: 9cm  Needle Gauge: 21 and 21 G    Additional Needles:  Procedures: ultrasound guided (picture in chart) Supraclavicular block Narrative:  Start time: 01/16/2013 1:22 PM End time: 01/16/2013 1:28 PM Injection made incrementally with aspirations every 5 mL.  Performed by: Personally  Anesthesiologist: C Tyvon Eggenberger  Additional Notes: Ultrasound guidance used to: id relevant anatomy, confirm needle position, local anesthetic spread, avoidance of vascular puncture. Picture saved. No complications. Block performed personally by Janetta Hora. Gelene Mink, MD    Supraclavicular block

## 2013-01-16 NOTE — Anesthesia Postprocedure Evaluation (Signed)
Anesthesia Post Note  Patient: Elizabeth Nash  Procedure(s) Performed: Procedure(s) (LRB): INCISION AND DRAINAGE (Right)  Anesthesia type: General  Patient location: PACU  Post pain: Pain level controlled  Post assessment: Patient's Cardiovascular Status Stable  Last Vitals:  Filed Vitals:   01/16/13 1500  BP: 123/74  Pulse: 91  Temp: 36.7 C  Resp: 15    Post vital signs: Reviewed and stable  Level of consciousness: alert  Complications: No apparent anesthesia complications

## 2013-01-16 NOTE — Brief Op Note (Signed)
01/16/2013  2:55 PM  PATIENT:  Elizabeth Nash  57 y.o. female  PRE-OPERATIVE DIAGNOSIS:  right index dip infection   POST-OPERATIVE DIAGNOSIS:  Right Index Distal Interphalangeal Infection  PROCEDURE:  Procedure(s): INCISION AND DRAINAGE (Right)  SURGEON:  Surgeon(s) and Role:    * Tami Ribas, MD - Primary  PHYSICIAN ASSISTANT:   ASSISTANTS: none   ANESTHESIA:   regional  EBL:     BLOOD ADMINISTERED:none  DRAINS: vessel loop and iodoform packing  LOCAL MEDICATIONS USED:  NONE  SPECIMEN:  Source of Specimen:  right index dip joint  DISPOSITION OF SPECIMEN:  micro  COUNTS:  YES  TOURNIQUET:   Total Tourniquet Time Documented: Upper Arm (Right) - 20 minutes Total: Upper Arm (Right) - 20 minutes   DICTATION: .Other Dictation: Dictation Number [id number]  PLAN OF CARE: Discharge to home after PACU  PATIENT DISPOSITION:  PACU - hemodynamically stable.

## 2013-01-16 NOTE — Anesthesia Preprocedure Evaluation (Signed)
Anesthesia Evaluation  Patient identified by MRN, date of birth, ID band Patient awake    Reviewed: Allergy & Precautions, H&P , NPO status , Patient's Chart, lab work & pertinent test results, reviewed documented beta blocker date and time   Airway Mallampati: II TM Distance: >3 FB Neck ROM: full    Dental   Pulmonary former smoker,  breath sounds clear to auscultation        Cardiovascular negative cardio ROS  Rhythm:regular     Neuro/Psych negative neurological ROS  negative psych ROS   GI/Hepatic negative GI ROS, Neg liver ROS,   Endo/Other  negative endocrine ROS  Renal/GU negative Renal ROS  negative genitourinary   Musculoskeletal   Abdominal   Peds  Hematology negative hematology ROS (+)   Anesthesia Other Findings See surgeon's H&P   Reproductive/Obstetrics negative OB ROS                           Anesthesia Physical Anesthesia Plan  ASA: I  Anesthesia Plan: MAC and Regional   Post-op Pain Management:    Induction: Intravenous  Airway Management Planned: Simple Face Mask  Additional Equipment:   Intra-op Plan:   Post-operative Plan:   Informed Consent: I have reviewed the patients History and Physical, chart, labs and discussed the procedure including the risks, benefits and alternatives for the proposed anesthesia with the patient or authorized representative who has indicated his/her understanding and acceptance.   Dental Advisory Given  Plan Discussed with: CRNA and Surgeon  Anesthesia Plan Comments:         Anesthesia Quick Evaluation

## 2013-01-16 NOTE — Progress Notes (Signed)
Assisted Dr. Frederick with right, ultrasound guided, supraclavicular block. Side rails up, monitors on throughout procedure. See vital signs in flow sheet. Tolerated Procedure well. 

## 2013-01-16 NOTE — H&P (Signed)
Elizabeth Nash is an 57 y.o. female.   Chief Complaint: right index infection HPI: 57 yo rhd female states she poked right index finger with locust thorn 9 days ago.  Seen 4 days ago by company nurse and remaining piece of thorn removed.  Began to have increasing swelling and pain.  Seen by PCP the following day and started on antibiotics.  Slight improvement since yesterday, but still with swelling, pain, redness.  No fevers, chills, night sweats.  Past Medical History  Diagnosis Date  . Anxiety   . Insomnia   . Arthritis     Past Surgical History  Procedure Laterality Date  . Cesarean section    . Tubal ligation    . Breast surgery Bilateral 02-21-2012    L-300cc,R-325cc    Family History  Problem Relation Age of Onset  . Hypertension Mother   . Hypothyroidism Mother   . Cancer Father     ESOPHAGEAL AND BRAIN   Social History:  reports that she quit smoking about 25 years ago. She does not have any smokeless tobacco history on file. She reports that she drinks alcohol. She reports that she does not use illicit drugs.  Allergies:  Allergies  Allergen Reactions  . Codeine   . Penicillins   . Sulfa Antibiotics     Medications Prior to Admission  Medication Sig Dispense Refill  . Calcium Citrate (CITRACAL PO) Take by mouth.      Tery Sanfilippo Calcium (STOOL SOFTENER PO) Take by mouth.      . doxycycline (VIBRAMYCIN) 100 MG capsule Take 1 capsule (100 mg total) by mouth 2 (two) times daily.  20 capsule  0  . etodolac (LODINE) 400 MG tablet Take 400 mg by mouth 2 (two) times daily.      Marland Kitchen HEMRIL-30 30 MG SUPP USE 1 SUPPOSITORY RECTALLY EVERY 6 HOURS AS NEEDED FOR HEMORRHOIDS  30 suppository  0  . hydrocortisone (ANUSOL-HC) 2.5 % rectal cream Place rectally 2 (two) times daily.  30 g  2  . lubiprostone (AMITIZA) 24 MCG capsule Take 1 capsule (24 mcg total) by mouth 2 (two) times daily with a meal.  180 capsule  1  . metroNIDAZOLE (FLAGYL) 500 MG tablet Take 1 tablet (500 mg total)  by mouth 3 (three) times daily.  30 tablet  0    Results for orders placed during the hospital encounter of 01/16/13 (from the past 48 hour(s))  POCT HEMOGLOBIN-HEMACUE     Status: Abnormal   Collection Time    01/16/13  1:41 PM      Result Value Range   Hemoglobin 15.1 (*) 12.0 - 15.0 g/dL    No results found.   A comprehensive review of systems was negative.  Blood pressure 112/66, pulse 89, resp. rate 19, SpO2 100.00%.  General appearance: alert, cooperative and appears stated age Head: Normocephalic, without obvious abnormality, atraumatic Neck: supple, symmetrical, trachea midline Resp: clear to auscultation bilaterally Cardio: regular rate and rhythm GI: non tender Extremities: intact sensation and capillary refill all digits.  +epl/fpl/io.  right index with swelling and erythema at dip joint.  ttp. no proximal streaking. Pulses: 2+ and symmetric Skin: Skin color, texture, turgor normal. No rashes or lesions Neurologic: Grossly normal Incision/Wound: As above  Assessment/Plan Right index dip joint infection.  Non operative and operative treatment options were discussed with the patient and patient wishes to proceed with operative treatment. Risks, benefits, and alternatives of surgery were discussed and the patient agrees with the plan  of care.   Elizabeth Nash R 01/16/2013, 2:01 PM

## 2013-01-16 NOTE — Op Note (Signed)
[id number] 

## 2013-01-16 NOTE — Transfer of Care (Signed)
Immediate Anesthesia Transfer of Care Note  Patient: Elizabeth Nash  Procedure(s) Performed: Procedure(s): INCISION AND DRAINAGE (Right)  Patient Location: PACU  Anesthesia Type:MAC combined with regional for post-op pain  Level of Consciousness: awake and patient cooperative  Airway & Oxygen Therapy: Patient Spontanous Breathing and Patient connected to face mask oxygen  Post-op Assessment: Report given to PACU RN and Post -op Vital signs reviewed and stable  Post vital signs: Reviewed and stable  Complications: No apparent anesthesia complications

## 2013-01-17 ENCOUNTER — Encounter (HOSPITAL_BASED_OUTPATIENT_CLINIC_OR_DEPARTMENT_OTHER): Payer: Self-pay | Admitting: Orthopedic Surgery

## 2013-01-17 NOTE — Op Note (Signed)
NAMEWINNA, Elizabeth Nash NO.:  [id number]  MEDICAL RECORD NO.:  [id number]  LOCATION:                                 FACILITY:  PHYSICIAN:  Betha Loa, MD             DATE OF BIRTH:  DATE OF PROCEDURE:  01/16/2013 DATE OF DISCHARGE:                              OPERATIVE REPORT   PREOPERATIVE DIAGNOSIS:  Right index finger distal interphalangeal joint infection.  POSTOPERATIVE DIAGNOSIS:  Right index finger distal interphalangeal joint infection.  PROCEDURE:  Right index finger distal interphalangeal joint irrigation and debridement.  SURGEON:  Betha Loa, MD.  ASSISTANT:  None.  ANESTHESIA:  Regional with sedation.  IV FLUIDS:  Per anesthesia flow sheet.  ESTIMATED BLOOD LOSS:  Minimal.  COMPLICATIONS:  None.  SPECIMENS:  Right index finger DIP joint.  CULTURES:  To micro.  TOURNIQUET TIME:  20 minutes.  DISPOSITION:  Stable to PACU.  INDICATIONS:  [name] is a 57 year old, right-hand dominant female who 9 days ago poked her right index finger at the DIP joint with a locust thorn.  Earlier this week, she began to have pain and swelling.  The piece of the thorn was removed by the company nurse.  She was started on antibiotics by her primary care physician the following day.  She presented to our office this morning with continued pain, erythema, and swelling of the right index finger at the DIP joint.  Recommended [name] going to the operating room for irrigation and debridement of the right index finger DIP joint.  Risks, benefits, and alternatives of the surgery were discussed including risk of blood loss, infection, damage to nerves, vessels, tendons, ligaments, bone; failure of surgery; need for additional surgery, complications with wound healing, continued pain, need for repeat irrigation and debridement.  She voiced understanding of these risks and elected to proceed.  OPERATIVE COURSE:  After being identified preoperatively by  myself, the patient and I agreed upon procedure and site of procedure.  Surgical site was marked.  The risks, benefits, and alternatives of surgery were reviewed and she wished to proceed.  Surgical consent had been signed. She was transported to the operating room and placed in the operating table in supine position with the right upper extremity on arm board. Regional block had been performed by anesthesia in the preoperative holding.  The right upper extremity was prepped and draped in normal sterile orthopedic fashion.  Surgical pause was performed between surgeons, anesthesia, and operating staff, and all were in agreement as to the patient, procedure, and the site of the procedure.  A hockey- stick shaped incision was made at the ulnar side of the right index finger.  This incorporated the thorn wound.  It was carried into the subcutaneous tissues by spreading technique.  There was inflammation in the subcutaneous tissues.  The DIP joint was entered underneath the extensor tendon.  There was gross purulence.  Cultures were taken for aerobes and anaerobes, and sent to Micro for examination.  No foreign matter was identified.  The joint was visualized.  It was copiously irrigated with 150 mL of sterile saline by Angiocath  syringe.  The wound was then copiously irrigated with sterile saline.  Vessel loop drain and iodoform packing were placed underneath the extensor tendon, and into the wound.  IV vancomycin was given after cultures had been taken.  The wound was dressed with sterile 4 x 4 and wrapped with Coban dressing lightly.  An AlumaFoam splint was placed and wrapped lightly with Coban dressing.  Tourniquet was deflated 20 minutes.  The operative drapes were broken down.  The patient was awoken from anesthesia safely.  She was transferred back to the stretcher and taken to the PACU in stable condition.  I will see her back in the office 4 days for postprocedure followup.  She has  doxycycline 100 mg p.o. b.i.d., as previously prescribed and has 8 days left.  I will give her a prescription for Percocet 5/325, 1-2 p.o. every 6 hours p.r.n. pain, dispensed #30.     Betha Loa, MD     KK/MEDQ  D:  01/16/2013  T:  01/17/2013  Job:  [id number]

## 2013-01-18 LAB — WOUND CULTURE: Culture: NO GROWTH

## 2013-01-21 LAB — ANAEROBIC CULTURE

## 2013-01-24 ENCOUNTER — Encounter: Payer: Self-pay | Admitting: *Deleted

## 2013-02-12 LAB — FUNGUS CULTURE W SMEAR: Fungal Smear: NONE SEEN

## 2013-02-17 ENCOUNTER — Ambulatory Visit (INDEPENDENT_AMBULATORY_CARE_PROVIDER_SITE_OTHER): Payer: BC Managed Care – PPO | Admitting: Nurse Practitioner

## 2013-02-17 ENCOUNTER — Encounter: Payer: Self-pay | Admitting: Nurse Practitioner

## 2013-02-17 VITALS — BP 127/79 | HR 64 | Temp 97.5°F | Ht 67.0 in | Wt 126.0 lb

## 2013-02-17 DIAGNOSIS — Z01419 Encounter for gynecological examination (general) (routine) without abnormal findings: Secondary | ICD-10-CM

## 2013-02-17 DIAGNOSIS — Z0000 Encounter for general adult medical examination without abnormal findings: Secondary | ICD-10-CM

## 2013-02-17 DIAGNOSIS — Z124 Encounter for screening for malignant neoplasm of cervix: Secondary | ICD-10-CM

## 2013-02-17 LAB — POCT URINALYSIS DIPSTICK
Bilirubin, UA: NEGATIVE
Blood, UA: NEGATIVE
Glucose, UA: NEGATIVE
Ketones, UA: NEGATIVE
Nitrite, UA: NEGATIVE
Protein, UA: NEGATIVE
Spec Grav, UA: 1.015
Urobilinogen, UA: NEGATIVE
pH, UA: 6

## 2013-02-17 LAB — POCT UA - MICROSCOPIC ONLY
Bacteria, U Microscopic: NEGATIVE
Casts, Ur, LPF, POC: NEGATIVE
Crystals, Ur, HPF, POC: NEGATIVE
Mucus, UA: NEGATIVE
RBC, urine, microscopic: NEGATIVE
Yeast, UA: NEGATIVE

## 2013-02-17 NOTE — Patient Instructions (Signed)

## 2013-02-17 NOTE — Progress Notes (Signed)
   Subjective:    Patient ID: Elizabeth Nash, female    DOB: 07-17-1955, 57 y.o.   MRN: [id number]  HPI  Patient here today for CPE and PAP- DOing well- No complaints today. Only medical problem is hemorrhoids - as long as she isn't constipated she can keep hemorrhoids under control.    Review of Systems  Constitutional: Negative.   HENT: Negative.   Eyes: Negative.   Respiratory: Negative.   Cardiovascular: Negative.   Gastrointestinal: Negative.   Endocrine: Negative.   Genitourinary: Negative.   Musculoskeletal: Negative.   Neurological: Negative.   Hematological: Negative.   Psychiatric/Behavioral: Negative.        Objective:   Physical Exam  Constitutional: She is oriented to person, place, and time. She appears well-developed and well-nourished.  HENT:  Head: Normocephalic.  Right Ear: Hearing, tympanic membrane, external ear and ear canal normal.  Left Ear: Hearing, tympanic membrane, external ear and ear canal normal.  Nose: Nose normal.  Mouth/Throat: Uvula is midline and oropharynx is clear and moist.  Eyes: Conjunctivae and EOM are normal. Pupils are equal, round, and reactive to light.  Neck: Normal range of motion and full passive range of motion without pain. Neck supple. No JVD present. Carotid bruit is not present. No mass and no thyromegaly present.  Cardiovascular: Normal rate, normal heart sounds and intact distal pulses.   No murmur heard. Pulmonary/Chest: Effort normal and breath sounds normal. Right breast exhibits no inverted nipple, no mass, no nipple discharge, no skin change and no tenderness. Left breast exhibits no inverted nipple, no mass, no nipple discharge, no skin change and no tenderness.  Abdominal: Soft. Bowel sounds are normal. She exhibits no mass. There is no tenderness.  Genitourinary: Vagina normal and uterus normal. No breast swelling, tenderness, discharge or bleeding.  bimanual exam-No adnexal masses or tenderness. Cervix nonparous na d  slightly stenosed No vaginal discharge  Musculoskeletal: Normal range of motion.  Lymphadenopathy:    She has no cervical adenopathy.  Neurological: She is alert and oriented to person, place, and time.  Skin: Skin is warm and dry.  Psychiatric: She has a normal mood and affect. Her behavior is normal. Judgment and thought content normal.   BP 127/79  Pulse 64  Temp(Src) 97.5 F (36.4 C) (Oral)  Ht 5\' 7"  (1.702 m)  Wt 126 lb (57.153 kg)  BMI 19.73 kg/m2       Assessment & Plan:   1. Annual physical exam   2. Encounter for routine gynecological examination    Orders Placed This Encounter  Procedures  . POCT UA - Microscopic Only  . POCT urinalysis dipstick   Meds ordered this encounter  Medications  . celecoxib (CELEBREX) 200 MG capsule    Sig: Take 200 mg by mouth 2 (two) times daily.    Continue all meds Labs pending Diet and exercise encouraged Health maintenance reviewed Follow up in 1 year  Mary-Margaret Daphine Deutscher, FNP

## 2013-02-19 LAB — PAP IG W/ RFLX HPV ASCU: PAP Smear Comment: 0

## 2013-03-01 LAB — AFB CULTURE WITH SMEAR (NOT AT ARMC): Acid Fast Smear: NONE SEEN

## 2013-12-15 ENCOUNTER — Other Ambulatory Visit: Payer: Self-pay | Admitting: Nurse Practitioner

## 2014-01-06 ENCOUNTER — Telehealth: Payer: Self-pay | Admitting: Nurse Practitioner

## 2014-01-06 NOTE — Telephone Encounter (Signed)
Rescheduled appt

## 2014-01-06 NOTE — Telephone Encounter (Signed)
LM, appointment for CPE and pap with Rockne Coons scheduled for 03-03-14.

## 2014-01-23 ENCOUNTER — Other Ambulatory Visit: Payer: Self-pay | Admitting: Nurse Practitioner

## 2014-03-02 ENCOUNTER — Other Ambulatory Visit: Payer: BC Managed Care – PPO | Admitting: Family Medicine

## 2014-03-03 ENCOUNTER — Ambulatory Visit (INDEPENDENT_AMBULATORY_CARE_PROVIDER_SITE_OTHER): Payer: BC Managed Care – PPO | Admitting: Nurse Practitioner

## 2014-03-03 ENCOUNTER — Ambulatory Visit (INDEPENDENT_AMBULATORY_CARE_PROVIDER_SITE_OTHER): Payer: BC Managed Care – PPO

## 2014-03-03 ENCOUNTER — Encounter: Payer: Self-pay | Admitting: Nurse Practitioner

## 2014-03-03 VITALS — BP 123/74 | HR 62 | Temp 97.9°F | Ht 67.0 in | Wt 131.0 lb

## 2014-03-03 DIAGNOSIS — Z0000 Encounter for general adult medical examination without abnormal findings: Secondary | ICD-10-CM

## 2014-03-03 DIAGNOSIS — Z01419 Encounter for gynecological examination (general) (routine) without abnormal findings: Secondary | ICD-10-CM

## 2014-03-03 LAB — POCT URINALYSIS DIPSTICK
Bilirubin, UA: NEGATIVE
Blood, UA: NEGATIVE
Glucose, UA: NEGATIVE
Ketones, UA: NEGATIVE
Nitrite, UA: NEGATIVE
Protein, UA: NEGATIVE
Spec Grav, UA: 1.01
Urobilinogen, UA: NEGATIVE
pH, UA: 6.5

## 2014-03-03 LAB — POCT UA - MICROSCOPIC ONLY
Bacteria, U Microscopic: NEGATIVE
Casts, Ur, LPF, POC: NEGATIVE
Crystals, Ur, HPF, POC: NEGATIVE
Mucus, UA: NEGATIVE
RBC, urine, microscopic: NEGATIVE
Yeast, UA: NEGATIVE

## 2014-03-03 MED ORDER — CYCLOBENZAPRINE HCL 10 MG PO TABS: 10.0000 mg | ORAL_TABLET | Freq: Three times a day (TID) | ORAL | Status: DC | PRN

## 2014-03-03 NOTE — Patient Instructions (Signed)

## 2014-03-03 NOTE — Progress Notes (Signed)
Subjective:    Patient ID: Elizabeth Nash, female    DOB: 01/02/1956, 58 y.o.   MRN: [id number]  HPI   Patient here today for CPE and PAP- DOing well- No complaints today. Only medical problem is hemorrhoids - as long as she isn't constipated she can keep hemorrhoids under control.    Review of Systems  Constitutional: Negative.   HENT: Negative.   Eyes: Negative.   Respiratory: Negative.   Cardiovascular: Negative.   Gastrointestinal: Negative.   Endocrine: Negative.   Genitourinary: Negative.   Musculoskeletal: Negative.   Neurological: Negative.   Hematological: Negative.   Psychiatric/Behavioral: Negative.        Objective:   Physical Exam  Constitutional: She is oriented to person, place, and time. She appears well-developed and well-nourished.  HENT:  Head: Normocephalic.  Right Ear: Hearing, tympanic membrane, external ear and ear canal normal.  Left Ear: Hearing, tympanic membrane, external ear and ear canal normal.  Nose: Nose normal.  Mouth/Throat: Uvula is midline and oropharynx is clear and moist.  Eyes: Conjunctivae and EOM are normal. Pupils are equal, round, and reactive to light.  Neck: Normal range of motion and full passive range of motion without pain. Neck supple. No JVD present. Carotid bruit is not present. No thyroid mass and no thyromegaly present.  Cardiovascular: Normal rate, normal heart sounds and intact distal pulses.   No murmur heard. Pulmonary/Chest: Effort normal and breath sounds normal. Right breast exhibits no inverted nipple, no mass, no nipple discharge, no skin change and no tenderness. Left breast exhibits no inverted nipple, no mass, no nipple discharge, no skin change and no tenderness.  bil breast augmentation   Abdominal: Soft. Bowel sounds are normal. She exhibits no mass. There is no tenderness.  Genitourinary: Vagina normal and uterus normal. No breast swelling, tenderness, discharge or bleeding.  bimanual exam-No adnexal masses  or tenderness. cerivical os stenosis- no vaginal discharge  Musculoskeletal: Normal range of motion.  Lymphadenopathy:    She has no cervical adenopathy.  Neurological: She is alert and oriented to person, place, and time.  Skin: Skin is warm and dry.  Psychiatric: She has a normal mood and affect. Her behavior is normal. Judgment and thought content normal.   BP 123/74 mmHg  Pulse 62  Temp(Src) 97.9 F (36.6 C) (Oral)  Ht $R'5\' 7"'vb$  (1.702 m)  Wt 131 lb (59.421 kg)  BMI 20.51 kg/m2   Results for orders placed or performed in visit on 03/03/14  POCT UA - Microscopic Only  Result Value Ref Range   WBC, Ur, HPF, POC 5-7    RBC, urine, microscopic neg    Bacteria, U Microscopic neg    Mucus, UA neg    Epithelial cells, urine per micros occ    Crystals, Ur, HPF, POC neg    Casts, Ur, LPF, POC neg    Yeast, UA neg   POCT urinalysis dipstick  Result Value Ref Range   Color, UA yellow    Clarity, UA clear    Glucose, UA neg    Bilirubin, UA neg    Ketones, UA neg    Spec Grav, UA 1.010    Blood, UA neg    pH, UA 6.5    Protein, UA neg    Urobilinogen, UA negative    Nitrite, UA neg    Leukocytes, UA Trace    EKG-NSR-Mary-Margaret Hassell Done, FNP Chest x ray- normal-Preliminary reading by Ronnald Collum, FNP  [facility]  Assessment & Plan:   1. Annual physical exam  - POCT UA - Microscopic Only - POCT urinalysis dipstick - POCT CBC - CMP14+EGFR - NMR, lipoprofile - Thyroid Panel With TSH - DG Chest 2 View; Future - EKG 12-Lead  2. Encounter for routine gynecological examination  - Pap IG w/ reflex to HPV when ASC-U    Labs reviewed at appointment Health maintenance reviewed Diet and exercise encouraged Continue all meds Follow up  In 6 months   [place], FNP

## 2014-03-04 NOTE — Addendum Note (Signed)
Addended by: Earlene Plater on: 03/04/2014 11:44 AM   Modules accepted: Miquel Dunn

## 2014-03-05 LAB — PAP IG W/ RFLX HPV ASCU: PAP Smear Comment: 0

## 2014-03-30 ENCOUNTER — Other Ambulatory Visit: Payer: BC Managed Care – PPO | Admitting: Nurse Practitioner

## 2014-05-13 ENCOUNTER — Other Ambulatory Visit: Payer: Self-pay | Admitting: Nurse Practitioner

## 2014-05-13 NOTE — Telephone Encounter (Signed)
Please advise on refill, last seen 03/04/15 for physical, no follow up appointment scheduled.

## 2014-08-10 ENCOUNTER — Other Ambulatory Visit: Payer: Self-pay | Admitting: Nurse Practitioner

## 2015-03-05 ENCOUNTER — Ambulatory Visit (INDEPENDENT_AMBULATORY_CARE_PROVIDER_SITE_OTHER): Payer: BLUE CROSS/BLUE SHIELD | Admitting: Nurse Practitioner

## 2015-03-05 ENCOUNTER — Encounter: Payer: Self-pay | Admitting: Nurse Practitioner

## 2015-03-05 VITALS — BP 121/76 | HR 61 | Temp 97.8°F | Ht 66.75 in | Wt 133.4 lb

## 2015-03-05 DIAGNOSIS — Z0000 Encounter for general adult medical examination without abnormal findings: Secondary | ICD-10-CM | POA: Diagnosis not present

## 2015-03-05 DIAGNOSIS — Z1159 Encounter for screening for other viral diseases: Secondary | ICD-10-CM

## 2015-03-05 DIAGNOSIS — K5901 Slow transit constipation: Secondary | ICD-10-CM

## 2015-03-05 DIAGNOSIS — Z1212 Encounter for screening for malignant neoplasm of rectum: Secondary | ICD-10-CM

## 2015-03-05 DIAGNOSIS — Z01419 Encounter for gynecological examination (general) (routine) without abnormal findings: Secondary | ICD-10-CM | POA: Diagnosis not present

## 2015-03-05 DIAGNOSIS — K641 Second degree hemorrhoids: Secondary | ICD-10-CM

## 2015-03-05 MED ORDER — LUBIPROSTONE 24 MCG PO CAPS: ORAL_CAPSULE | ORAL | Status: DC

## 2015-03-05 MED ORDER — HYDROCORTISONE ACETATE 25 MG RE SUPP: RECTAL | Status: DC

## 2015-03-05 NOTE — Progress Notes (Signed)
   Subjective:    Patient ID: Elizabeth Nash, female    DOB: May 09, 1955, 59 y.o.   MRN: [id number]  HPI  Patient in today for CPE and pap- she is doing well. Current medical problems include: Constipation- takes amitiza which really helps Hemorrhoids-tries to keep constipation under control to prevent flare ups with hemorrhoids   Review of Systems  Constitutional: Negative.   HENT: Negative.   Respiratory: Negative.   Cardiovascular: Negative.   Genitourinary: Negative.   Neurological: Negative.   Psychiatric/Behavioral: Negative.   All other systems reviewed and are negative.      Objective:   Physical Exam  Constitutional: She is oriented to person, place, and time. She appears well-developed and well-nourished.  HENT:  Head: Normocephalic.  Right Ear: Hearing, tympanic membrane, external ear and ear canal normal.  Left Ear: Hearing, tympanic membrane, external ear and ear canal normal.  Nose: Nose normal.  Mouth/Throat: Uvula is midline and oropharynx is clear and moist.  Eyes: Conjunctivae and EOM are normal. Pupils are equal, round, and reactive to light.  Neck: Normal range of motion and full passive range of motion without pain. Neck supple. No JVD present. Carotid bruit is not present. No thyroid mass and no thyromegaly present.  Cardiovascular: Normal rate, normal heart sounds and intact distal pulses.   No murmur heard. Pulmonary/Chest: Effort normal and breath sounds normal. Right breast exhibits no inverted nipple, no mass, no nipple discharge, no skin change and no tenderness. Left breast exhibits no inverted nipple, no mass, no nipple discharge, no skin change and no tenderness.  Abdominal: Soft. Bowel sounds are normal. She exhibits no mass. There is no tenderness.  Genitourinary: Vagina normal and uterus normal. No breast swelling, tenderness, discharge or bleeding.  bimanual exam-No adnexal masses or tenderness. Cervix stenosis and pink- no vaginal discharge    Musculoskeletal: Normal range of motion.  Lymphadenopathy:    She has no cervical adenopathy.  Neurological: She is alert and oriented to person, place, and time.  Skin: Skin is warm and dry.  Psychiatric: She has a normal mood and affect. Her behavior is normal. Judgment and thought content normal.    BP 121/76 mmHg  Pulse 61  Temp(Src) 97.8 F (36.6 C) (Oral)  Ht 5' 6.75" (1.695 m)  Wt 133 lb 6.4 oz (60.51 kg)  BMI 21.06 kg/m2       Assessment & Plan:  1. Annual physical exam - CMP14+EGFR - Lipid panel - CBC with Differential/Platelet - Thyroid Panel With TSH  2. Encounter for routine gynecological examination - Pap IG w/ reflex to HPV when ASC-U  3. Slow transit constipation Increase fiber in diet - lubiprostone (AMITIZA) 24 MCG capsule; TAKE 1 CAPSULE TWICE A DAY WITH MEALS  Dispense: 180 capsule; Refill: 1  4. Second degree hemorrhoids Continue stool softners - hydrocortisone (ANUSOL-HC) 25 MG suppository; PLACE 1 SUPPOSITORY (25 MG TOTAL) RECTALLY 2 (TWO) TIMES DAILY.  Dispense: 12 suppository; Refill: 0  5. Need for hepatitis C screening test - Hepatitis C antibody  6. Screening for malignant neoplasm of the rectum - Fecal occult blood, imunochemical; Future    Labs pending Health maintenance reviewed Diet and exercise encouraged Continue all meds Follow up  In 1 year   [place], FNP

## 2015-03-05 NOTE — Patient Instructions (Signed)
Health Maintenance, Female Adopting a healthy lifestyle and getting preventive care can go a long way to promote health and wellness. Talk with your health care provider about what schedule of regular examinations is right for you. This is a good chance for you to check in with your provider about disease prevention and staying healthy. In between checkups, there are plenty of things you can do on your own. Experts have done a lot of research about which lifestyle changes and preventive measures are most likely to keep you healthy. Ask your health care provider for more information. WEIGHT AND DIET  Eat a healthy diet  Be sure to include plenty of vegetables, fruits, low-fat dairy products, and lean protein.  Do not eat a lot of foods high in solid fats, added sugars, or salt.  Get regular exercise. This is one of the most important things you can do for your health.  Most adults should exercise for at least 150 minutes each week. The exercise should increase your heart rate and make you sweat (moderate-intensity exercise).  Most adults should also do strengthening exercises at least twice a week. This is in addition to the moderate-intensity exercise.  Maintain a healthy weight  Body mass index (BMI) is a measurement that can be used to identify possible weight problems. It estimates body fat based on height and weight. Your health care provider can help determine your BMI and help you achieve or maintain a healthy weight.  For females 20 years of age and older:   A BMI below 18.5 is considered underweight.  A BMI of 18.5 to 24.9 is normal.  A BMI of 25 to 29.9 is considered overweight.  A BMI of 30 and above is considered obese.  Watch levels of cholesterol and blood lipids  You should start having your blood tested for lipids and cholesterol at 57 years of age, then have this test every 5 years.  You may need to have your cholesterol levels checked more often if:  Your lipid  or cholesterol levels are high.  You are older than 57 years of age.  You are at high risk for heart disease.  CANCER SCREENING   Lung Cancer  Lung cancer screening is recommended for adults 55-80 years old who are at high risk for lung cancer because of a history of smoking.  A yearly low-dose CT scan of the lungs is recommended for people who:  Currently smoke.  Have quit within the past 15 years.  Have at least a 30-pack-year history of smoking. A pack year is smoking an average of one pack of cigarettes a day for 1 year.  Yearly screening should continue until it has been 15 years since you quit.  Yearly screening should stop if you develop a health problem that would prevent you from having lung cancer treatment.  Breast Cancer  Practice breast self-awareness. This means understanding how your breasts normally appear and feel.  It also means doing regular breast self-exams. Let your health care provider know about any changes, no matter how small.  If you are in your 20s or 30s, you should have a clinical breast exam (CBE) by a health care provider every 1-3 years as part of a regular health exam.  If you are 40 or older, have a CBE every year. Also consider having a breast X-ray (mammogram) every year.  If you have a family history of breast cancer, talk to your health care provider about genetic screening.  If you   are at high risk for breast cancer, talk to your health care provider about having an MRI and a mammogram every year.  Breast cancer gene (BRCA) assessment is recommended for women who have family members with BRCA-related cancers. BRCA-related cancers include:  Breast.  Ovarian.  Tubal.  Peritoneal cancers.  Results of the assessment will determine the need for genetic counseling and BRCA1 and BRCA2 testing. Cervical Cancer Your health care provider may recommend that you be screened regularly for cancer of the pelvic organs (ovaries, uterus, and  vagina). This screening involves a pelvic examination, including checking for microscopic changes to the surface of your cervix (Pap test). You may be encouraged to have this screening done every 3 years, beginning at age 21.  For women ages 30-65, health care providers may recommend pelvic exams and Pap testing every 3 years, or they may recommend the Pap and pelvic exam, combined with testing for human papilloma virus (HPV), every 5 years. Some types of HPV increase your risk of cervical cancer. Testing for HPV may also be done on women of any age with unclear Pap test results.  Other health care providers may not recommend any screening for nonpregnant women who are considered low risk for pelvic cancer and who do not have symptoms. Ask your health care provider if a screening pelvic exam is right for you.  If you have had past treatment for cervical cancer or a condition that could lead to cancer, you need Pap tests and screening for cancer for at least 20 years after your treatment. If Pap tests have been discontinued, your risk factors (such as having a new sexual partner) need to be reassessed to determine if screening should resume. Some women have medical problems that increase the chance of getting cervical cancer. In these cases, your health care provider may recommend more frequent screening and Pap tests. Colorectal Cancer  This type of cancer can be detected and often prevented.  Routine colorectal cancer screening usually begins at 57 years of age and continues through 57 years of age.  Your health care provider may recommend screening at an earlier age if you have risk factors for colon cancer.  Your health care provider may also recommend using home test kits to check for hidden blood in the stool.  A small camera at the end of a tube can be used to examine your colon directly (sigmoidoscopy or colonoscopy). This is done to check for the earliest forms of colorectal  cancer.  Routine screening usually begins at age 50.  Direct examination of the colon should be repeated every 5-10 years through 57 years of age. However, you may need to be screened more often if early forms of precancerous polyps or small growths are found. Skin Cancer  Check your skin from head to toe regularly.  Tell your health care provider about any new moles or changes in moles, especially if there is a change in a mole's shape or color.  Also tell your health care provider if you have a mole that is larger than the size of a pencil eraser.  Always use sunscreen. Apply sunscreen liberally and repeatedly throughout the day.  Protect yourself by wearing long sleeves, pants, a wide-brimmed hat, and sunglasses whenever you are outside. HEART DISEASE, DIABETES, AND HIGH BLOOD PRESSURE   High blood pressure causes heart disease and increases the risk of stroke. High blood pressure is more likely to develop in:  People who have blood pressure in the high end   of the normal range (130-139/85-89 mm Hg).  People who are overweight or obese.  People who are African American.  If you are 38-23 years of age, have your blood pressure checked every 3-5 years. If you are 61 years of age or older, have your blood pressure checked every year. You should have your blood pressure measured twice--once when you are at a hospital or clinic, and once when you are not at a hospital or clinic. Record the average of the two measurements. To check your blood pressure when you are not at a hospital or clinic, you can use:  An automated blood pressure machine at a pharmacy.  A home blood pressure monitor.  If you are between 45 years and 39 years old, ask your health care provider if you should take aspirin to prevent strokes.  Have regular diabetes screenings. This involves taking a blood sample to check your fasting blood sugar level.  If you are at a normal weight and have a low risk for diabetes,  have this test once every three years after 57 years of age.  If you are overweight and have a high risk for diabetes, consider being tested at a younger age or more often. PREVENTING INFECTION  Hepatitis B  If you have a higher risk for hepatitis B, you should be screened for this virus. You are considered at high risk for hepatitis B if:  You were born in a country where hepatitis B is common. Ask your health care provider which countries are considered high risk.  Your parents were born in a high-risk country, and you have not been immunized against hepatitis B (hepatitis B vaccine).  You have HIV or AIDS.  You use needles to inject street drugs.  You live with someone who has hepatitis B.  You have had sex with someone who has hepatitis B.  You get hemodialysis treatment.  You take certain medicines for conditions, including cancer, organ transplantation, and autoimmune conditions. Hepatitis C  Blood testing is recommended for:  Everyone born from [birth year] through 1963.  Anyone with known risk factors for hepatitis C. Sexually transmitted infections (STIs)  You should be screened for sexually transmitted infections (STIs) including gonorrhea and chlamydia if:  You are sexually active and are younger than 57 years of age.  You are older than 57 years of age and your health care provider tells you that you are at risk for this type of infection.  Your sexual activity has changed since you were last screened and you are at an increased risk for chlamydia or gonorrhea. Ask your health care provider if you are at risk.  If you do not have HIV, but are at risk, it may be recommended that you take a prescription medicine daily to prevent HIV infection. This is called pre-exposure prophylaxis (PrEP). You are considered at risk if:  You are sexually active and do not regularly use condoms or know the HIV status of your partner(s).  You take drugs by injection.  You are sexually  active with a partner who has HIV. Talk with your health care provider about whether you are at high risk of being infected with HIV. If you choose to begin PrEP, you should first be tested for HIV. You should then be tested every 3 months for as long as you are taking PrEP.  PREGNANCY   If you are premenopausal and you may become pregnant, ask your health care provider about preconception counseling.  If you may  become pregnant, take 400 to 800 micrograms (mcg) of folic acid every day.  If you want to prevent pregnancy, talk to your health care provider about birth control (contraception). OSTEOPOROSIS AND MENOPAUSE   Osteoporosis is a disease in which the bones lose minerals and strength with aging. This can result in serious bone fractures. Your risk for osteoporosis can be identified using a bone density scan.  If you are 61 years of age or older, or if you are at risk for osteoporosis and fractures, ask your health care provider if you should be screened.  Ask your health care provider whether you should take a calcium or vitamin D supplement to lower your risk for osteoporosis.  Menopause may have certain physical symptoms and risks.  Hormone replacement therapy may reduce some of these symptoms and risks. Talk to your health care provider about whether hormone replacement therapy is right for you.  HOME CARE INSTRUCTIONS   Schedule regular health, dental, and eye exams.  Stay current with your immunizations.   Do not use any tobacco products including cigarettes, chewing tobacco, or electronic cigarettes.  If you are pregnant, do not drink alcohol.  If you are breastfeeding, limit how much and how often you drink alcohol.  Limit alcohol intake to no more than 1 drink per day for nonpregnant women. One drink equals 12 ounces of beer, 5 ounces of wine, or 1 ounces of hard liquor.  Do not use street drugs.  Do not share needles.  Ask your health care provider for help if  you need support or information about quitting drugs.  Tell your health care provider if you often feel depressed.  Tell your health care provider if you have ever been abused or do not feel safe at home.   This information is not intended to replace advice given to you by your health care provider. Make sure you discuss any questions you have with your health care provider.   Document Released: 09/05/2010 Document Revised: 03/13/2014 Document Reviewed: 01/22/2013 Elsevier Interactive Patient Education [employer].

## 2015-03-06 LAB — CMP14+EGFR
ALT: 22 IU/L (ref 0–32)
AST: 22 IU/L (ref 0–40)
Albumin/Globulin Ratio: 1.5 (ref 1.1–2.5)
Albumin: 4.3 g/dL (ref 3.5–5.5)
Alkaline Phosphatase: 74 IU/L (ref 39–117)
BUN/Creatinine Ratio: 23 (ref 9–23)
BUN: 15 mg/dL (ref 6–24)
Bilirubin Total: 0.4 mg/dL (ref 0.0–1.2)
CO2: 24 mmol/L (ref 18–29)
Calcium: 9.2 mg/dL (ref 8.7–10.2)
Chloride: 102 mmol/L (ref 96–106)
Creatinine, Ser: 0.66 mg/dL (ref 0.57–1.00)
GFR calc Af Amer: 113 mL/min/{1.73_m2} (ref >59)
GFR calc non Af Amer: 98 mL/min/{1.73_m2} (ref >59)
Globulin, Total: 2.8 g/dL (ref 1.5–4.5)
Glucose: 89 mg/dL (ref 65–99)
Potassium: 5.2 mmol/L (ref 3.5–5.2)
Sodium: 139 mmol/L (ref 134–144)
Total Protein: 7.1 g/dL (ref 6.0–8.5)

## 2015-03-06 LAB — HEPATITIS C ANTIBODY: Hep C Virus Ab: <0.1 s/co ratio (ref 0.0–0.9)

## 2015-03-06 LAB — CBC WITH DIFFERENTIAL/PLATELET
Basophils Absolute: 0 10*3/uL (ref 0.0–0.2)
Basos: 1 %
EOS (ABSOLUTE): 0.2 10*3/uL (ref 0.0–0.4)
Eos: 3 %
Hematocrit: 40.2 % (ref 34.0–46.6)
Hemoglobin: 13.5 g/dL (ref 11.1–15.9)
Immature Grans (Abs): 0 10*3/uL (ref 0.0–0.1)
Immature Granulocytes: 0 %
Lymphocytes Absolute: 2 10*3/uL (ref 0.7–3.1)
Lymphs: 40 %
MCH: 31.2 pg (ref 26.6–33.0)
MCHC: 33.6 g/dL (ref 31.5–35.7)
MCV: 93 fL (ref 79–97)
Monocytes Absolute: 0.4 10*3/uL (ref 0.1–0.9)
Monocytes: 8 %
Neutrophils Absolute: 2.5 10*3/uL (ref 1.4–7.0)
Neutrophils: 48 %
Platelets: 223 10*3/uL (ref 150–379)
RBC: 4.33 x10E6/uL (ref 3.77–5.28)
RDW: 13.3 % (ref 12.3–15.4)
WBC: 5.1 10*3/uL (ref 3.4–10.8)

## 2015-03-06 LAB — LIPID PANEL
Chol/HDL Ratio: 1.8 ratio units (ref 0.0–4.4)
Cholesterol, Total: 203 mg/dL — ABNORMAL HIGH (ref 100–199)
HDL: 114 mg/dL (ref >39)
LDL Calculated: 78 mg/dL (ref 0–99)
Triglycerides: 54 mg/dL (ref 0–149)
VLDL Cholesterol Cal: 11 mg/dL (ref 5–40)

## 2015-03-06 LAB — THYROID PANEL WITH TSH
Free Thyroxine Index: 1.5 (ref 1.2–4.9)
T3 Uptake Ratio: 29 % (ref 24–39)
T4, Total: 5.1 ug/dL (ref 4.5–12.0)
TSH: 2.84 u[IU]/mL (ref 0.450–4.500)

## 2015-03-10 LAB — PAP IG W/ RFLX HPV ASCU: PAP Smear Comment: 0

## 2015-07-23 DIAGNOSIS — Z0000 Encounter for general adult medical examination without abnormal findings: Secondary | ICD-10-CM | POA: Diagnosis not present

## 2015-07-23 DIAGNOSIS — B001 Herpesviral vesicular dermatitis: Secondary | ICD-10-CM | POA: Diagnosis not present

## 2015-09-22 ENCOUNTER — Telehealth: Payer: Self-pay | Admitting: Nurse Practitioner

## 2015-10-20 DIAGNOSIS — L570 Actinic keratosis: Secondary | ICD-10-CM | POA: Diagnosis not present

## 2015-10-20 DIAGNOSIS — C44729 Squamous cell carcinoma of skin of left lower limb, including hip: Secondary | ICD-10-CM | POA: Diagnosis not present

## 2015-10-20 DIAGNOSIS — D099 Carcinoma in situ, unspecified: Secondary | ICD-10-CM

## 2015-10-20 DIAGNOSIS — D485 Neoplasm of uncertain behavior of skin: Secondary | ICD-10-CM | POA: Diagnosis not present

## 2015-10-20 HISTORY — DX: Carcinoma in situ, unspecified: D09.9

## 2015-11-22 ENCOUNTER — Other Ambulatory Visit: Payer: Self-pay | Admitting: Nurse Practitioner

## 2015-11-22 DIAGNOSIS — K5901 Slow transit constipation: Secondary | ICD-10-CM

## 2015-11-22 NOTE — Telephone Encounter (Signed)
Last refill without being seen 

## 2016-01-19 DIAGNOSIS — J019 Acute sinusitis, unspecified: Secondary | ICD-10-CM | POA: Diagnosis not present

## 2016-03-02 ENCOUNTER — Telehealth: Payer: Self-pay | Admitting: Nurse Practitioner

## 2016-03-03 NOTE — Telephone Encounter (Signed)
Pt given appt with MMM 1/18 at 8 am.

## 2016-03-10 DIAGNOSIS — K30 Functional dyspepsia: Secondary | ICD-10-CM | POA: Diagnosis not present

## 2016-03-10 DIAGNOSIS — S39012A Strain of muscle, fascia and tendon of lower back, initial encounter: Secondary | ICD-10-CM | POA: Diagnosis not present

## 2016-03-16 ENCOUNTER — Telehealth: Payer: Self-pay | Admitting: Nurse Practitioner

## 2016-03-23 ENCOUNTER — Other Ambulatory Visit: Payer: BLUE CROSS/BLUE SHIELD | Admitting: Nurse Practitioner

## 2016-04-28 ENCOUNTER — Ambulatory Visit (INDEPENDENT_AMBULATORY_CARE_PROVIDER_SITE_OTHER): Payer: BLUE CROSS/BLUE SHIELD | Admitting: Nurse Practitioner

## 2016-04-28 ENCOUNTER — Encounter: Payer: Self-pay | Admitting: Nurse Practitioner

## 2016-04-28 VITALS — BP 128/79 | HR 71 | Temp 97.4°F | Ht 66.0 in | Wt 136.0 lb

## 2016-04-28 DIAGNOSIS — Z0000 Encounter for general adult medical examination without abnormal findings: Secondary | ICD-10-CM

## 2016-04-28 DIAGNOSIS — Z01419 Encounter for gynecological examination (general) (routine) without abnormal findings: Secondary | ICD-10-CM

## 2016-04-28 LAB — URINALYSIS, COMPLETE
Bilirubin, UA: NEGATIVE
Glucose, UA: NEGATIVE
Ketones, UA: NEGATIVE
Nitrite, UA: NEGATIVE
Protein, UA: NEGATIVE
Specific Gravity, UA: 1.02 (ref 1.005–1.030)
Urobilinogen, Ur: 0.2 mg/dL (ref 0.2–1.0)
pH, UA: 7 (ref 5.0–7.5)

## 2016-04-28 LAB — MICROSCOPIC EXAMINATION

## 2016-04-28 NOTE — Progress Notes (Signed)
   Subjective:    Patient ID: Elizabeth Nash, female    DOB: 01/17/1956, 61 y.o.   MRN: [id number]  HPI Patient in today for annual physical exam and PAP. SHe has no current medical problems and is on no chronic medications. SHe has not been seen since 2016. SHe denies any recent problems and she has no complaints today.   Review of Systems  Constitutional: Negative.   HENT: Negative.   Respiratory: Negative.   Cardiovascular: Negative.   Gastrointestinal: Negative.   Genitourinary: Negative.   Neurological: Negative.   Psychiatric/Behavioral: Negative.   All other systems reviewed and are negative.      Objective:   Physical Exam  Constitutional: She is oriented to person, place, and time. She appears well-developed and well-nourished.  HENT:  Head: Normocephalic.  Right Ear: Hearing, tympanic membrane, external ear and ear canal normal.  Left Ear: Hearing, tympanic membrane, external ear and ear canal normal.  Nose: Nose normal.  Mouth/Throat: Uvula is midline and oropharynx is clear and moist.  Eyes: Conjunctivae and EOM are normal. Pupils are equal, round, and reactive to light.  Neck: Normal range of motion and full passive range of motion without pain. Neck supple. No JVD present. Carotid bruit is not present. No thyroid mass and no thyromegaly present.  Cardiovascular: Normal rate, normal heart sounds and intact distal pulses.   No murmur heard. Pulmonary/Chest: Effort normal and breath sounds normal. Right breast exhibits no inverted nipple, no mass, no nipple discharge, no skin change and no tenderness. Left breast exhibits no inverted nipple, no mass, no nipple discharge, no skin change and no tenderness.  Abdominal: Soft. Bowel sounds are normal. She exhibits no mass. There is no tenderness.  Genitourinary: Vagina normal and uterus normal. No breast swelling, tenderness, discharge or bleeding. No vaginal discharge found.  Genitourinary Comments: bimanual exam-No adnexal  masses or tenderness. Cervical stenosis  Musculoskeletal: Normal range of motion.  Lymphadenopathy:    She has no cervical adenopathy.  Neurological: She is alert and oriented to person, place, and time.  Skin: Skin is warm and dry.  Psychiatric: She has a normal mood and affect. Her behavior is normal. Judgment and thought content normal.   BP 128/79   Pulse 71   Temp 97.4 F (36.3 C) (Oral)   Ht '5\' 6"'$  (1.676 m)   Wt 136 lb (61.7 kg)   BMI 21.95 kg/m      Assessment & Plan:  1. Annual physical exam - CMP14+EGFR - Thyroid Panel With TSH - CBC with Differential/Platelet  2. Gynecologic exam normal - IGP, Aptima HPV, rfx 16/18,45 - Urinalysis, Complete  Patient will schedule mammogram  Mary-Margaret Hassell Done, FNP

## 2016-04-28 NOTE — Patient Instructions (Signed)

## 2016-04-29 LAB — CMP14+EGFR
ALT: 10 IU/L (ref 0–32)
AST: 12 IU/L (ref 0–40)
Albumin/Globulin Ratio: 1.4 (ref 1.2–2.2)
Albumin: 4.2 g/dL (ref 3.5–5.5)
Alkaline Phosphatase: 59 IU/L (ref 39–117)
BUN/Creatinine Ratio: 25 — ABNORMAL HIGH (ref 9–23)
BUN: 17 mg/dL (ref 6–24)
Bilirubin Total: 0.4 mg/dL (ref 0.0–1.2)
CO2: 28 mmol/L (ref 18–29)
Calcium: 9.1 mg/dL (ref 8.7–10.2)
Chloride: 102 mmol/L (ref 96–106)
Creatinine, Ser: 0.68 mg/dL (ref 0.57–1.00)
GFR calc Af Amer: 112 mL/min/{1.73_m2} (ref >59)
GFR calc non Af Amer: 97 mL/min/{1.73_m2} (ref >59)
Globulin, Total: 2.9 g/dL (ref 1.5–4.5)
Glucose: 83 mg/dL (ref 65–99)
Potassium: 4 mmol/L (ref 3.5–5.2)
Sodium: 143 mmol/L (ref 134–144)
Total Protein: 7.1 g/dL (ref 6.0–8.5)

## 2016-04-29 LAB — CBC WITH DIFFERENTIAL/PLATELET
Basophils Absolute: 0 10*3/uL (ref 0.0–0.2)
Basos: 1 %
EOS (ABSOLUTE): 0.2 10*3/uL (ref 0.0–0.4)
Eos: 4 %
Hematocrit: 37.8 % (ref 34.0–46.6)
Hemoglobin: 12.4 g/dL (ref 11.1–15.9)
Immature Grans (Abs): 0 10*3/uL (ref 0.0–0.1)
Immature Granulocytes: 0 %
Lymphocytes Absolute: 2.7 10*3/uL (ref 0.7–3.1)
Lymphs: 42 %
MCH: 30.5 pg (ref 26.6–33.0)
MCHC: 32.8 g/dL (ref 31.5–35.7)
MCV: 93 fL (ref 79–97)
Monocytes Absolute: 0.7 10*3/uL (ref 0.1–0.9)
Monocytes: 11 %
Neutrophils Absolute: 2.6 10*3/uL (ref 1.4–7.0)
Neutrophils: 42 %
Platelets: 213 10*3/uL (ref 150–379)
RBC: 4.06 x10E6/uL (ref 3.77–5.28)
RDW: 13.9 % (ref 12.3–15.4)
WBC: 6.3 10*3/uL (ref 3.4–10.8)

## 2016-04-29 LAB — THYROID PANEL WITH TSH
Free Thyroxine Index: 1.7 (ref 1.2–4.9)
T3 Uptake Ratio: 27 % (ref 24–39)
T4, Total: 6.2 ug/dL (ref 4.5–12.0)
TSH: 2.89 u[IU]/mL (ref 0.450–4.500)

## 2016-05-02 LAB — IGP, APTIMA HPV, RFX 16/18,45
HPV Aptima: NEGATIVE
PAP Smear Comment: 0

## 2016-05-05 ENCOUNTER — Other Ambulatory Visit: Payer: Self-pay | Admitting: Nurse Practitioner

## 2016-06-16 DIAGNOSIS — Z1231 Encounter for screening mammogram for malignant neoplasm of breast: Secondary | ICD-10-CM | POA: Diagnosis not present

## 2016-07-10 ENCOUNTER — Other Ambulatory Visit: Payer: Self-pay | Admitting: Nurse Practitioner

## 2016-07-10 DIAGNOSIS — K5901 Slow transit constipation: Secondary | ICD-10-CM

## 2016-08-07 ENCOUNTER — Other Ambulatory Visit: Payer: Self-pay | Admitting: Nurse Practitioner

## 2016-09-01 DIAGNOSIS — H6090 Unspecified otitis externa, unspecified ear: Secondary | ICD-10-CM | POA: Diagnosis not present

## 2017-04-06 ENCOUNTER — Telehealth: Payer: Self-pay | Admitting: Nurse Practitioner

## 2017-04-06 NOTE — Telephone Encounter (Signed)
NO VM _ jhb 2/1 It is OK for pt to have this done when she sees MMM - Just let her know the day of.

## 2017-04-11 NOTE — Telephone Encounter (Signed)
LMOM about dexa

## 2017-04-19 ENCOUNTER — Telehealth: Payer: Self-pay | Admitting: Nurse Practitioner

## 2017-04-19 NOTE — Telephone Encounter (Signed)
Will be glad to change to linzess but patient has 2 different mail ordered pharmacies in her chart. I attempted to contact to find out which one she uses and could not reach her.

## 2017-04-20 MED ORDER — LINACLOTIDE 145 MCG PO CAPS: 145.0000 ug | ORAL_CAPSULE | Freq: Every day | ORAL | 2 refills | Status: DC

## 2017-05-18 ENCOUNTER — Encounter: Payer: BLUE CROSS/BLUE SHIELD | Admitting: Nurse Practitioner

## 2017-05-18 ENCOUNTER — Ambulatory Visit (INDEPENDENT_AMBULATORY_CARE_PROVIDER_SITE_OTHER): Payer: BLUE CROSS/BLUE SHIELD

## 2017-05-18 ENCOUNTER — Encounter: Payer: Self-pay | Admitting: Nurse Practitioner

## 2017-05-18 ENCOUNTER — Ambulatory Visit (INDEPENDENT_AMBULATORY_CARE_PROVIDER_SITE_OTHER): Payer: BLUE CROSS/BLUE SHIELD | Admitting: Nurse Practitioner

## 2017-05-18 VITALS — BP 120/76 | HR 60 | Temp 97.0°F | Ht 66.0 in | Wt 138.0 lb

## 2017-05-18 DIAGNOSIS — Z0000 Encounter for general adult medical examination without abnormal findings: Secondary | ICD-10-CM | POA: Diagnosis not present

## 2017-05-18 DIAGNOSIS — Z780 Asymptomatic menopausal state: Secondary | ICD-10-CM

## 2017-05-18 DIAGNOSIS — K640 First degree hemorrhoids: Secondary | ICD-10-CM

## 2017-05-18 DIAGNOSIS — K5904 Chronic idiopathic constipation: Secondary | ICD-10-CM

## 2017-05-18 DIAGNOSIS — Z13820 Encounter for screening for osteoporosis: Secondary | ICD-10-CM

## 2017-05-18 DIAGNOSIS — M8589 Other specified disorders of bone density and structure, multiple sites: Secondary | ICD-10-CM | POA: Diagnosis not present

## 2017-05-18 NOTE — Patient Instructions (Signed)
Bone Health Bones protect organs, store calcium, and anchor muscles. Good health habits, such as eating nutritious foods and exercising regularly, are important for maintaining healthy bones. They can also help to prevent a condition that causes bones to lose density and become weak and brittle (osteoporosis). Why is bone mass important? Bone mass refers to the amount of bone tissue that you have. The higher your bone mass, the stronger your bones. An important step toward having healthy bones throughout life is to have strong and dense bones during childhood. A young adult who has a high bone mass is more likely to have a high bone mass later in life. Bone mass at its greatest it is called peak bone mass. A large decline in bone mass occurs in older adults. In women, it occurs about the time of menopause. During this time, it is important to practice good health habits, because if more bone is lost than what is replaced, the bones will become less healthy and more likely to break (fracture). If you find that you have a low bone mass, you may be able to prevent osteoporosis or further bone loss by changing your diet and lifestyle. How can I find out if my bone mass is low? Bone mass can be measured with an X-ray test that is called a bone mineral density (BMD) test. This test is recommended for all women who are age 65 or older. It may also be recommended for men who are age 70 or older, or for people who are more likely to develop osteoporosis due to:  Having bones that break easily.  Having a long-term disease that weakens bones, such as kidney disease or rheumatoid arthritis.  Having menopause earlier than normal.  Taking medicine that weakens bones, such as steroids, thyroid hormones, or hormone treatment for breast cancer or prostate cancer.  Smoking.  Drinking three or more alcoholic drinks each day.  What are the nutritional recommendations for healthy bones? To have healthy bones, you  need to get enough of the right minerals and vitamins. Most nutrition experts recommend getting these nutrients from the foods that you eat. Nutritional recommendations vary from person to person. Ask your health care provider what is healthy for you. Here are some general guidelines. Calcium Recommendations Calcium is the most important (essential) mineral for bone health. Most people can get enough calcium from their diet, but supplements may be recommended for people who are at risk for osteoporosis. Good sources of calcium include:  Dairy products, such as low-fat or nonfat milk, cheese, and yogurt.  Dark green leafy vegetables, such as bok choy and broccoli.  Calcium-fortified foods, such as orange juice, cereal, bread, soy beverages, and tofu products.  Nuts, such as almonds.  Follow these recommended amounts for daily calcium intake:  Children, age 1?3: 700 mg.  Children, age 4?8: 1,000 mg.  Children, age 9?13: 1,300 mg.  Teens, age 14?18: 1,300 mg.  Adults, age 19?50: 1,000 mg.  Adults, age 51?70: ? Men: 1,000 mg. ? Women: 1,200 mg.  Adults, age 71 or older: 1,200 mg.  Pregnant and breastfeeding females: ? Teens: 1,300 mg. ? Adults: 1,000 mg.  Vitamin D Recommendations Vitamin D is the most essential vitamin for bone health. It helps the body to absorb calcium. Sunlight stimulates the skin to make vitamin D, so be sure to get enough sunlight. If you live in a cold climate or you do not get outside often, your health care provider may recommend that you take vitamin   D supplements. Good sources of vitamin D in your diet include:  Egg yolks.  Saltwater fish.  Milk and cereal fortified with vitamin D.  Follow these recommended amounts for daily vitamin D intake:  Children and teens, age 1?18: 600 international units.  Adults, age 50 or younger: 400-800 international units.  Adults, age 51 or older: 800-1,000 international units.  Other Nutrients Other nutrients  for bone health include:  Phosphorus. This mineral is found in meat, poultry, dairy foods, nuts, and legumes. The recommended daily intake for adult men and adult women is 700 mg.  Magnesium. This mineral is found in seeds, nuts, dark green vegetables, and legumes. The recommended daily intake for adult men is 400?420 mg. For adult women, it is 310?320 mg.  Vitamin K. This vitamin is found in green leafy vegetables. The recommended daily intake is 120 mg for adult men and 90 mg for adult women.  What type of physical activity is best for building and maintaining healthy bones? Weight-bearing and strength-building activities are important for building and maintaining peak bone mass. Weight-bearing activities cause muscles and bones to work against gravity. Strength-building activities increases muscle strength that supports bones. Weight-bearing and muscle-building activities include:  Walking and hiking.  Jogging and running.  Dancing.  Gym exercises.  Lifting weights.  Tennis and racquetball.  Climbing stairs.  Aerobics.  Adults should get at least 30 minutes of moderate physical activity on most days. Children should get at least 60 minutes of moderate physical activity on most days. Ask your health care provide what type of exercise is best for you. Where can I find more information? For more information, check out the following websites:  National Osteoporosis Foundation: http://nof.org/learn/basics  National Institutes of Health: http://www.niams.nih.gov/Health_Info/Bone/Bone_Health/bone_health_for_life.asp  This information is not intended to replace advice given to you by your health care provider. Make sure you discuss any questions you have with your health care provider. Document Released: 05/13/2003 Document Revised: 09/10/2015 Document Reviewed: 02/25/2014 Elsevier Interactive Patient Education  2018 Elsevier Inc.  

## 2017-05-18 NOTE — Progress Notes (Signed)
Subjective:    Patient ID: Elizabeth Nash, female    DOB: December 29, 1955, 62 y.o.   MRN: [id number]  HPI  Elizabeth Nash is here today for annual physical exam and  follow up of chronic medical problem.  Outpatient Encounter Medications as of 05/18/2017  Medication Sig  . Calcium Citrate (CITRACAL PO) Take by mouth.  Mariane Baumgarten Calcium (STOOL SOFTENER PO) Take by mouth. Reported on 03/05/2015  . hydrocortisone (ANUSOL-HC) 25 MG suppository PLACE 1 SUPPOSITORY (25 MG TOTAL) RECTALLY 2 (TWO) TIMES DAILY.  Marland Kitchen linaclotide (LINZESS) 145 MCG CAPS capsule Take 1 capsule (145 mcg total) by mouth daily before breakfast.   No facility-administered encounter medications on file as of 05/18/2017.     1. Annual physical exam  Had pap last year which was normal  2. Grade I hemorrhoids  Uses Anusol when needed- works well.  3. Chronic idiopathic constipation  linzess has really helped with constipation, but she still has occasional days when she struggles to have a bowel movement. linzess doe snot work as well as Psychologist, [employer] and [employer] services.    New complaints: None today  Social history: Works 12 hour shifts at [employer]    Review of Systems  Constitutional: Negative for activity change and appetite change.  HENT: Negative.   Eyes: Negative for pain.  Respiratory: Negative for shortness of breath.   Cardiovascular: Negative for chest pain, palpitations and leg swelling.  Gastrointestinal: Negative for abdominal pain.  Endocrine: Negative for polydipsia.  Genitourinary: Negative.   Skin: Negative for rash.  Neurological: Negative for dizziness, weakness and headaches.  Hematological: Does not bruise/bleed easily.  Psychiatric/Behavioral: Negative.   All other systems reviewed and are negative.      Objective:   Physical Exam  Constitutional: She is oriented to person, place, and time. She appears well-developed and well-nourished.  HENT:  Nose: Nose normal.  Mouth/Throat: Oropharynx is clear and moist.  Eyes:  EOM are normal.  Neck: Trachea normal, normal range of motion and full passive range of motion without pain. Neck supple. No JVD present. Carotid bruit is not present. No thyromegaly present.  Cardiovascular: Normal rate, regular rhythm, normal heart sounds and intact distal pulses. Exam reveals no gallop and no friction rub.  No murmur heard. Pulmonary/Chest: Effort normal and breath sounds normal. Right breast exhibits no inverted nipple, no mass, no nipple discharge, no skin change and no tenderness. Left breast exhibits no inverted nipple, no mass, no nipple discharge, no skin change and no tenderness. Breasts are symmetrical.  Abdominal: Soft. Bowel sounds are normal. She exhibits no distension and no mass. There is no tenderness.  Musculoskeletal: Normal range of motion.  Lymphadenopathy:    She has no cervical adenopathy.  Neurological: She is alert and oriented to person, place, and time. She has normal reflexes.  Skin: Skin is warm and dry.  Psychiatric: She has a normal mood and affect. Her behavior is normal. Judgment and thought content normal.   BP 120/76   Pulse 60   Temp (!) 97 F (36.1 C) (Oral)   Ht '5\' 6"'$  (1.676 m)   Wt 138 lb (62.6 kg)   BMI 22.27 kg/m       Assessment & Plan:  1. Annual physical exam - CBC with Differential/Platelet - CMP14+EGFR - Lipid panel - Thyroid Panel With TSH - VITAMIN D 25 Hydroxy (Vit-D Deficiency, Fractures)  2. Grade I hemorrhoids  3. Chronic idiopathic constipation Continue linzess as rx  4. Screening for osteoporosis - DG [facility] DEXA  Labs pending Health maintenance reviewed Diet and exercise encouraged Continue all meds Follow up  In 1 year   [place], FNP

## 2017-05-19 LAB — THYROID PANEL WITH TSH
Free Thyroxine Index: 1.2 (ref 1.2–4.9)
T3 Uptake Ratio: 22 % — ABNORMAL LOW (ref 24–39)
T4, Total: 5.5 ug/dL (ref 4.5–12.0)
TSH: 2.51 u[IU]/mL (ref 0.450–4.500)

## 2017-05-19 LAB — CBC WITH DIFFERENTIAL/PLATELET
Basophils Absolute: 0 10*3/uL (ref 0.0–0.2)
Basos: 0 %
EOS (ABSOLUTE): 0.2 10*3/uL (ref 0.0–0.4)
Eos: 3 %
Hematocrit: 38.7 % (ref 34.0–46.6)
Hemoglobin: 13.3 g/dL (ref 11.1–15.9)
Immature Grans (Abs): 0 10*3/uL (ref 0.0–0.1)
Immature Granulocytes: 0 %
Lymphocytes Absolute: 1.8 10*3/uL (ref 0.7–3.1)
Lymphs: 35 %
MCH: 32 pg (ref 26.6–33.0)
MCHC: 34.4 g/dL (ref 31.5–35.7)
MCV: 93 fL (ref 79–97)
Monocytes Absolute: 0.5 10*3/uL (ref 0.1–0.9)
Monocytes: 11 %
Neutrophils Absolute: 2.6 10*3/uL (ref 1.4–7.0)
Neutrophils: 51 %
Platelets: 286 10*3/uL (ref 150–379)
RBC: 4.15 x10E6/uL (ref 3.77–5.28)
RDW: 13.1 % (ref 12.3–15.4)
WBC: 5.1 10*3/uL (ref 3.4–10.8)

## 2017-05-19 LAB — LIPID PANEL
Chol/HDL Ratio: 2.3 ratio (ref 0.0–4.4)
Cholesterol, Total: 192 mg/dL (ref 100–199)
HDL: 84 mg/dL (ref >39)
LDL Calculated: 95 mg/dL (ref 0–99)
Triglycerides: 63 mg/dL (ref 0–149)
VLDL Cholesterol Cal: 13 mg/dL (ref 5–40)

## 2017-05-19 LAB — CMP14+EGFR
ALT: 13 IU/L (ref 0–32)
AST: 14 IU/L (ref 0–40)
Albumin/Globulin Ratio: 1.4 (ref 1.2–2.2)
Albumin: 4.2 g/dL (ref 3.5–5.5)
Alkaline Phosphatase: 61 IU/L (ref 39–117)
BUN/Creatinine Ratio: 20 (ref 9–23)
BUN: 15 mg/dL (ref 6–24)
Bilirubin Total: 0.3 mg/dL (ref 0.0–1.2)
CO2: 25 mmol/L (ref 20–29)
Calcium: 9.2 mg/dL (ref 8.7–10.2)
Chloride: 103 mmol/L (ref 96–106)
Creatinine, Ser: 0.74 mg/dL (ref 0.57–1.00)
GFR calc Af Amer: 103 mL/min/{1.73_m2} (ref >59)
GFR calc non Af Amer: 89 mL/min/{1.73_m2} (ref >59)
Globulin, Total: 3 g/dL (ref 1.5–4.5)
Glucose: 85 mg/dL (ref 65–99)
Potassium: 4.6 mmol/L (ref 3.5–5.2)
Sodium: 143 mmol/L (ref 134–144)
Total Protein: 7.2 g/dL (ref 6.0–8.5)

## 2017-05-19 LAB — VITAMIN D 25 HYDROXY (VIT D DEFICIENCY, FRACTURES): Vit D, 25-Hydroxy: 37.9 ng/mL (ref 30.0–100.0)

## 2017-07-13 DIAGNOSIS — M79676 Pain in unspecified toe(s): Secondary | ICD-10-CM | POA: Diagnosis not present

## 2017-07-13 DIAGNOSIS — J189 Pneumonia, unspecified organism: Secondary | ICD-10-CM | POA: Diagnosis not present

## 2017-07-13 DIAGNOSIS — H6090 Unspecified otitis externa, unspecified ear: Secondary | ICD-10-CM | POA: Diagnosis not present

## 2017-07-13 DIAGNOSIS — B001 Herpesviral vesicular dermatitis: Secondary | ICD-10-CM | POA: Diagnosis not present

## 2017-07-20 DIAGNOSIS — K5900 Constipation, unspecified: Secondary | ICD-10-CM | POA: Diagnosis not present

## 2017-07-20 DIAGNOSIS — H6090 Unspecified otitis externa, unspecified ear: Secondary | ICD-10-CM | POA: Diagnosis not present

## 2018-04-01 DIAGNOSIS — Z1231 Encounter for screening mammogram for malignant neoplasm of breast: Secondary | ICD-10-CM | POA: Diagnosis not present

## 2018-04-01 LAB — HM MAMMOGRAPHY

## 2018-04-25 ENCOUNTER — Ambulatory Visit (INDEPENDENT_AMBULATORY_CARE_PROVIDER_SITE_OTHER): Payer: BLUE CROSS/BLUE SHIELD | Admitting: Nurse Practitioner

## 2018-04-25 ENCOUNTER — Encounter: Payer: Self-pay | Admitting: Nurse Practitioner

## 2018-04-25 VITALS — BP 121/72 | HR 62 | Temp 97.3°F | Ht 66.0 in | Wt 142.0 lb

## 2018-04-25 DIAGNOSIS — K640 First degree hemorrhoids: Secondary | ICD-10-CM | POA: Diagnosis not present

## 2018-04-25 DIAGNOSIS — K5904 Chronic idiopathic constipation: Secondary | ICD-10-CM | POA: Diagnosis not present

## 2018-04-25 DIAGNOSIS — Z0000 Encounter for general adult medical examination without abnormal findings: Secondary | ICD-10-CM | POA: Diagnosis not present

## 2018-04-25 DIAGNOSIS — Z0001 Encounter for general adult medical examination with abnormal findings: Secondary | ICD-10-CM

## 2018-04-25 DIAGNOSIS — F411 Generalized anxiety disorder: Secondary | ICD-10-CM | POA: Diagnosis not present

## 2018-04-25 LAB — URINALYSIS, COMPLETE
Bilirubin, UA: NEGATIVE
Glucose, UA: NEGATIVE
Ketones, UA: NEGATIVE
Nitrite, UA: NEGATIVE
Protein, UA: NEGATIVE
RBC, UA: NEGATIVE
Specific Gravity, UA: 1.02 (ref 1.005–1.030)
Urobilinogen, Ur: 0.2 mg/dL (ref 0.2–1.0)
pH, UA: 7 (ref 5.0–7.5)

## 2018-04-25 LAB — MICROSCOPIC EXAMINATION
Epithelial Cells (non renal): >10 /hpf — AB (ref 0–10)
RBC, UA: NONE SEEN /hpf (ref 0–2)
Renal Epithel, UA: NONE SEEN /hpf

## 2018-04-25 MED ORDER — ALPRAZOLAM 0.25 MG PO TABS: 0.2500 mg | ORAL_TABLET | ORAL | 0 refills | Status: DC | PRN

## 2018-04-25 NOTE — Patient Instructions (Signed)

## 2018-04-25 NOTE — Progress Notes (Signed)
Subjective:    Patient ID: Elizabeth Nash, female    DOB: 1955-10-20, 63 y.o.   MRN: [id number]   Chief Complaint: Annual Exam   HPI:  1. Annual physical exam   2. Chronic idiopathic constipation  Patient still has constipation. She uses stool softeners most  Days.  3. Grade I hemorrhoids  Has developed due to her chronic constipation. The anusol usually works well.    Outpatient Encounter Medications as of 04/25/2018  Medication Sig  . BIOTIN PO Take by mouth.  . Calcium Citrate (CITRACAL PO) Take by mouth.  . cyclobenzaprine (FLEXERIL) 10 MG tablet TAKE 1 TABLET BY MOUTH AS NEEDED AT BEDTIME  . Docusate Calcium (STOOL SOFTENER PO) Take by mouth. Reported on 03/05/2015  . hydrocortisone (ANUSOL-HC) 25 MG suppository PLACE 1 SUPPOSITORY (25 MG TOTAL) RECTALLY 2 (TWO) TIMES DAILY.  . vitamin C (ASCORBIC ACID) 500 MG tablet Take 500 mg by mouth daily.  . [DISCONTINUED] linaclotide (LINZESS) 145 MCG CAPS capsule Take 1 capsule (145 mcg total) by mouth daily before breakfast.       New complaints: Having some anxiety- would like low dose pill to take as needed.     Review of Systems  Constitutional: Negative for activity change and appetite change.  HENT: Negative.   Eyes: Negative for pain.  Respiratory: Negative for shortness of breath.   Cardiovascular: Negative for chest pain, palpitations and leg swelling.  Gastrointestinal: Negative for abdominal pain.  Endocrine: Negative for polydipsia.  Genitourinary: Negative.   Skin: Negative for rash.  Neurological: Negative for dizziness, weakness and headaches.  Hematological: Does not bruise/bleed easily.  Psychiatric/Behavioral: Negative.   All other systems reviewed and are negative.      Objective:   Physical Exam Vitals signs and nursing note reviewed.  Constitutional:      General: She is not in acute distress.    Appearance: Normal appearance. She is well-developed.  HENT:     Head: Normocephalic.     Nose:  Nose normal.  Eyes:     Pupils: Pupils are equal, round, and reactive to light.  Neck:     Musculoskeletal: Normal range of motion and neck supple.     Vascular: No carotid bruit or JVD.  Cardiovascular:     Rate and Rhythm: Normal rate and regular rhythm.     Heart sounds: Normal heart sounds.  Pulmonary:     Effort: Pulmonary effort is normal. No respiratory distress.     Breath sounds: Normal breath sounds. No wheezing or rales.  Chest:     Chest wall: No tenderness.  Abdominal:     General: Bowel sounds are normal. There is no distension or abdominal bruit.     Palpations: Abdomen is soft. There is no hepatomegaly, splenomegaly, mass or pulsatile mass.     Tenderness: There is no abdominal tenderness.  Musculoskeletal: Normal range of motion.  Lymphadenopathy:     Cervical: No cervical adenopathy.  Skin:    General: Skin is warm and dry.  Neurological:     Mental Status: She is alert and oriented to person, place, and time.     Deep Tendon Reflexes: Reflexes are normal and symmetric.  Psychiatric:        Behavior: Behavior normal.        Thought Content: Thought content normal.        Judgment: Judgment normal.    BP 121/72   Pulse 62   Temp (!) 97.3 F (36.3 C) (Oral)  Ht '5\' 6"'$  (1.676 m)   Wt 142 lb (64.4 kg)   BMI 22.92 kg/m         Assessment & Plan:  Elizabeth Nash comes in today with chief complaint of Annual Exam   Diagnosis and orders addressed:  1. Annual physical exam - Urinalysis, Complete - CBC with Differential/Platelet - CMP14+EGFR - Lipid panel - Thyroid Panel With TSH - IGP, Aptima HPV, rfx 16/18,45  2. Chronic idiopathic constipation Force fluids Stool softner asneeded  3. Grade I hemorrhoids Continue anusol  4. GAD (generalized anxiety disorder) Stress management discussed - ALPRAZolam (XANAX) 0.25 MG tablet; Take 1 tablet (0.25 mg total) by mouth as needed for anxiety.  Dispense: 30 tablet; Refill: 0   Labs pending Health  Maintenance reviewed Diet and exercise encouraged  Follow up plan: 1 year   [place], FNP

## 2018-04-26 LAB — CMP14+EGFR
ALT: 24 IU/L (ref 0–32)
AST: 17 IU/L (ref 0–40)
Albumin/Globulin Ratio: 1.6 (ref 1.2–2.2)
Albumin: 4.4 g/dL (ref 3.8–4.9)
Alkaline Phosphatase: 65 IU/L (ref 39–117)
BUN/Creatinine Ratio: 24 (ref 12–28)
BUN: 16 mg/dL (ref 8–27)
Bilirubin Total: 0.4 mg/dL (ref 0.0–1.2)
CO2: 25 mmol/L (ref 20–29)
Calcium: 9.2 mg/dL (ref 8.7–10.3)
Chloride: 103 mmol/L (ref 96–106)
Creatinine, Ser: 0.66 mg/dL (ref 0.57–1.00)
GFR calc Af Amer: 111 mL/min/{1.73_m2} (ref >59)
GFR calc non Af Amer: 96 mL/min/{1.73_m2} (ref >59)
Globulin, Total: 2.8 g/dL (ref 1.5–4.5)
Glucose: 90 mg/dL (ref 65–99)
Potassium: 3.8 mmol/L (ref 3.5–5.2)
Sodium: 142 mmol/L (ref 134–144)
Total Protein: 7.2 g/dL (ref 6.0–8.5)

## 2018-04-26 LAB — CBC WITH DIFFERENTIAL/PLATELET
Basophils Absolute: 0.1 10*3/uL (ref 0.0–0.2)
Basos: 1 %
EOS (ABSOLUTE): 0.2 10*3/uL (ref 0.0–0.4)
Eos: 4 %
Hematocrit: 38.1 % (ref 34.0–46.6)
Hemoglobin: 13.5 g/dL (ref 11.1–15.9)
Immature Grans (Abs): 0 10*3/uL (ref 0.0–0.1)
Immature Granulocytes: 0 %
Lymphocytes Absolute: 2 10*3/uL (ref 0.7–3.1)
Lymphs: 34 %
MCH: 32.9 pg (ref 26.6–33.0)
MCHC: 35.4 g/dL (ref 31.5–35.7)
MCV: 93 fL (ref 79–97)
Monocytes Absolute: 0.5 10*3/uL (ref 0.1–0.9)
Monocytes: 9 %
Neutrophils Absolute: 3 10*3/uL (ref 1.4–7.0)
Neutrophils: 52 %
Platelets: 215 10*3/uL (ref 150–450)
RBC: 4.1 x10E6/uL (ref 3.77–5.28)
RDW: 11.9 % (ref 11.7–15.4)
WBC: 5.8 10*3/uL (ref 3.4–10.8)

## 2018-04-26 LAB — LIPID PANEL
Chol/HDL Ratio: 2.1 ratio (ref 0.0–4.4)
Cholesterol, Total: 193 mg/dL (ref 100–199)
HDL: 91 mg/dL (ref >39)
LDL Calculated: 90 mg/dL (ref 0–99)
Triglycerides: 58 mg/dL (ref 0–149)
VLDL Cholesterol Cal: 12 mg/dL (ref 5–40)

## 2018-04-26 LAB — THYROID PANEL WITH TSH
Free Thyroxine Index: 1.4 (ref 1.2–4.9)
T3 Uptake Ratio: 25 % (ref 24–39)
T4, Total: 5.4 ug/dL (ref 4.5–12.0)
TSH: 3.47 u[IU]/mL (ref 0.450–4.500)

## 2018-04-30 LAB — IGP, APTIMA HPV, RFX 16/18,45: HPV Aptima: NEGATIVE

## 2018-05-02 DIAGNOSIS — C44729 Squamous cell carcinoma of skin of left lower limb, including hip: Secondary | ICD-10-CM | POA: Diagnosis not present

## 2018-05-02 DIAGNOSIS — L570 Actinic keratosis: Secondary | ICD-10-CM | POA: Diagnosis not present

## 2018-05-02 DIAGNOSIS — D485 Neoplasm of uncertain behavior of skin: Secondary | ICD-10-CM | POA: Diagnosis not present

## 2018-07-12 ENCOUNTER — Other Ambulatory Visit: Payer: Self-pay | Admitting: Nurse Practitioner

## 2018-07-12 DIAGNOSIS — F411 Generalized anxiety disorder: Secondary | ICD-10-CM

## 2018-07-15 ENCOUNTER — Other Ambulatory Visit: Payer: Self-pay | Admitting: Nurse Practitioner

## 2018-07-15 DIAGNOSIS — F411 Generalized anxiety disorder: Secondary | ICD-10-CM

## 2018-10-07 ENCOUNTER — Ambulatory Visit (INDEPENDENT_AMBULATORY_CARE_PROVIDER_SITE_OTHER): Payer: BC Managed Care – PPO | Admitting: Nurse Practitioner

## 2018-10-07 ENCOUNTER — Encounter: Payer: Self-pay | Admitting: Nurse Practitioner

## 2018-10-07 DIAGNOSIS — F411 Generalized anxiety disorder: Secondary | ICD-10-CM

## 2018-10-07 DIAGNOSIS — M25511 Pain in right shoulder: Secondary | ICD-10-CM | POA: Diagnosis not present

## 2018-10-07 MED ORDER — ALPRAZOLAM 0.25 MG PO TABS: 0.2500 mg | ORAL_TABLET | ORAL | 0 refills | Status: DC | PRN

## 2018-10-07 MED ORDER — CYCLOBENZAPRINE HCL 10 MG PO TABS: ORAL_TABLET | ORAL | 0 refills | Status: DC

## 2018-10-07 MED ORDER — PREDNISONE 10 MG (21) PO TBPK: ORAL_TABLET | ORAL | 0 refills | Status: DC

## 2018-10-07 NOTE — Progress Notes (Signed)
Virtual Visit via telephone Note Due to COVID-19 pandemic this visit was conducted virtually. This visit type was conducted due to national recommendations for restrictions regarding the COVID-19 Pandemic (e.g. social distancing, sheltering in place) in an effort to limit this patient's exposure and mitigate transmission in our community. All issues noted in this document were discussed and addressed.  A physical exam was not performed with this format.  I connected with Elizabeth Nash on 10/07/18 at 4:00 by telephone and verified that I am speaking with the correct person using two identifiers. Elizabeth Nash is currently located at work and no one is currently with her during visit. The provider, Mary-Margaret Hassell Done, FNP is located in their office at time of visit.  I discussed the limitations, risks, security and privacy concerns of performing an evaluation and management service by telephone and the availability of in person appointments. I also discussed with the patient that there may be a patient responsible charge related to this service. The patient expressed understanding and agreed to proceed.   History and Present Illness:  Patient calls in c/o right shoulder pain. The pain is is posterior and runs down her back. She works at [employer] and she is constantly raising her arm up and down. Rates pain 8/10 on bad days and 8/10 on good days. This has been going on for greater the 5 weeks. She has used aleve and flexeril which helps some.  Denies any distal weakness in hand. * wants refill of xanax that she takes occasionally for stress- her last rx was in February for #30.  Review of Systems  Musculoskeletal: Positive for joint pain (right shoulder).  All other systems reviewed and are negative.    Observations/Objective: Alert and oriented- answers all questions appropriately No distress in voice.  Assessment and Plan: Elizabeth Nash in today with chief complaint of Shoulder Pain   1. GAD  (generalized anxiety disorder) Stress management - ALPRAZolam (XANAX) 0.25 MG tablet; Take 1 tablet (0.25 mg total) by mouth as needed for anxiety.  Dispense: 30 tablet; Refill: 0  2. Acute pain of right shoulder Meds ordered this encounter  Medications  . predniSONE (STERAPRED UNI-PAK 21 TAB) 10 MG (21) TBPK tablet    Sig: As directed x 6 days    Dispense:  21 tablet    Refill:  0    Order Specific Question:   Supervising Provider    Answer:   Caryl Pina A [id number]  . ALPRAZolam (XANAX) 0.25 MG tablet    Sig: Take 1 tablet (0.25 mg total) by mouth as needed for anxiety.    Dispense:  30 tablet    Refill:  0    Order Specific Question:   Supervising Provider    Answer:   Caryl Pina A [id number]  . cyclobenzaprine (FLEXERIL) 10 MG tablet    Sig: TAKE 1 TABLET BY MOUTH AS NEEDED AT BEDTIME    Dispense:  30 tablet    Refill:  0    Order Specific Question:   Supervising Provider    Answer:   Caryl Pina A [[id number]]   Moist heat Rest Can try voltaren gel OTC when working    Follow Up Instructions: prn    I discussed the assessment and treatment plan with the patient. The patient was provided an opportunity to ask questions and all were answered. The patient agreed with the plan and demonstrated an understanding of the instructions.   The patient was advised to call back or seek an  in-person evaluation if the symptoms worsen or if the condition fails to improve as anticipated.  The above assessment and management plan was discussed with the patient. The patient verbalized understanding of and has agreed to the management plan. Patient is aware to call the clinic if symptoms persist or worsen. Patient is aware when to return to the clinic for a follow-up visit. Patient educated on when it is appropriate to go to the emergency department.   Time call ended:  14:00  I provided 15 minutes of non-face-to-face time during this encounter.    Mary-Margaret  Hassell Done, FNP

## 2018-12-11 DIAGNOSIS — M9901 Segmental and somatic dysfunction of cervical region: Secondary | ICD-10-CM | POA: Diagnosis not present

## 2018-12-11 DIAGNOSIS — M9903 Segmental and somatic dysfunction of lumbar region: Secondary | ICD-10-CM | POA: Diagnosis not present

## 2018-12-11 DIAGNOSIS — M9902 Segmental and somatic dysfunction of thoracic region: Secondary | ICD-10-CM | POA: Diagnosis not present

## 2018-12-11 DIAGNOSIS — M62830 Muscle spasm of back: Secondary | ICD-10-CM | POA: Diagnosis not present

## 2018-12-12 DIAGNOSIS — M9902 Segmental and somatic dysfunction of thoracic region: Secondary | ICD-10-CM | POA: Diagnosis not present

## 2018-12-12 DIAGNOSIS — M9903 Segmental and somatic dysfunction of lumbar region: Secondary | ICD-10-CM | POA: Diagnosis not present

## 2018-12-12 DIAGNOSIS — M9901 Segmental and somatic dysfunction of cervical region: Secondary | ICD-10-CM | POA: Diagnosis not present

## 2018-12-12 DIAGNOSIS — M62830 Muscle spasm of back: Secondary | ICD-10-CM | POA: Diagnosis not present

## 2019-01-02 ENCOUNTER — Telehealth: Payer: Self-pay | Admitting: Nurse Practitioner

## 2019-01-02 MED ORDER — LINACLOTIDE 145 MCG PO CAPS: 145.0000 ug | ORAL_CAPSULE | Freq: Every day | ORAL | 3 refills | Status: DC

## 2019-01-02 NOTE — Telephone Encounter (Signed)
What is the name of the medication? linzess  Have you contacted your pharmacy to request a refill? no  Which pharmacy would you like this sent to? [facility]   Patient notified that their request is being sent to the clinical staff for review and that they should receive a call once it is complete. If they do not receive a call within 24 hours they can check with their pharmacy or our office.

## 2019-01-02 NOTE — Telephone Encounter (Signed)
Not on med list please advise 

## 2019-01-29 ENCOUNTER — Other Ambulatory Visit: Payer: Self-pay | Admitting: Nurse Practitioner

## 2019-01-29 DIAGNOSIS — F411 Generalized anxiety disorder: Secondary | ICD-10-CM

## 2019-04-28 ENCOUNTER — Other Ambulatory Visit: Payer: Self-pay

## 2019-04-28 ENCOUNTER — Encounter: Payer: Self-pay | Admitting: Nurse Practitioner

## 2019-04-28 ENCOUNTER — Ambulatory Visit (INDEPENDENT_AMBULATORY_CARE_PROVIDER_SITE_OTHER): Payer: BC Managed Care – PPO | Admitting: Nurse Practitioner

## 2019-04-28 ENCOUNTER — Ambulatory Visit (INDEPENDENT_AMBULATORY_CARE_PROVIDER_SITE_OTHER): Payer: BC Managed Care – PPO

## 2019-04-28 VITALS — BP 120/75 | HR 60 | Temp 96.8°F | Resp 20 | Ht 66.0 in | Wt 140.0 lb

## 2019-04-28 DIAGNOSIS — K5904 Chronic idiopathic constipation: Secondary | ICD-10-CM

## 2019-04-28 DIAGNOSIS — K635 Polyp of colon: Secondary | ICD-10-CM | POA: Diagnosis not present

## 2019-04-28 DIAGNOSIS — Z0001 Encounter for general adult medical examination with abnormal findings: Secondary | ICD-10-CM | POA: Diagnosis not present

## 2019-04-28 DIAGNOSIS — F411 Generalized anxiety disorder: Secondary | ICD-10-CM | POA: Diagnosis not present

## 2019-04-28 DIAGNOSIS — Z0000 Encounter for general adult medical examination without abnormal findings: Secondary | ICD-10-CM

## 2019-04-28 DIAGNOSIS — Z23 Encounter for immunization: Secondary | ICD-10-CM

## 2019-04-28 LAB — URINALYSIS, COMPLETE
Bilirubin, UA: NEGATIVE
Glucose, UA: NEGATIVE
Ketones, UA: NEGATIVE
Leukocytes,UA: NEGATIVE
Nitrite, UA: NEGATIVE
Protein,UA: NEGATIVE
RBC, UA: NEGATIVE
Specific Gravity, UA: 1.025 (ref 1.005–1.030)
Urobilinogen, Ur: 0.2 mg/dL (ref 0.2–1.0)
pH, UA: 5 (ref 5.0–7.5)

## 2019-04-28 LAB — MICROSCOPIC EXAMINATION
Bacteria, UA: NONE SEEN
Epithelial Cells (non renal): NONE SEEN /hpf (ref 0–10)
RBC, Urine: NONE SEEN /hpf (ref 0–2)
Renal Epithel, UA: NONE SEEN /hpf
WBC, UA: NONE SEEN /hpf (ref 0–5)

## 2019-04-28 MED ORDER — ALPRAZOLAM 0.25 MG PO TABS: 0.2500 mg | ORAL_TABLET | ORAL | 0 refills | Status: DC | PRN

## 2019-04-28 NOTE — Addendum Note (Signed)
Addended by: Rolena Infante on: 04/28/2019 04:33 PM   Modules accepted: Orders

## 2019-04-28 NOTE — Progress Notes (Signed)
Subjective:    Patient ID: Elizabeth Nash, female    DOB: June 23, 1955, 64 y.o.   MRN: [id number]   Chief Complaint: Annual Exam (No pap)    HPI:  1. Annual physical exam Breast exam but no PAP  2. GAD (generalized anxiety disorder) Has anxiety on occasion in which she takes xanax for. She say sthat a prescription will last her over a year. GAD 7 : Generalized Anxiety Score 04/28/2019  Nervous, Anxious, on Edge 0  Control/stop worrying 0  Worry too much - different things 0  Trouble relaxing 1  Restless 0  Easily annoyed or irritable 0  Afraid - awful might happen 0  Total GAD 7 Score 1  Anxiety Difficulty Not difficult at all      3. Chronic idiopathic constipation Has had this all her life. She has been on stool softner for any years. She has linzess that works well. But she says she cannot take it and work.    Outpatient Encounter Medications as of 04/28/2019  Medication Sig  . ALPRAZolam (XANAX) 0.25 MG tablet Take 1 tablet (0.25 mg total) by mouth as needed for anxiety.  Marland Kitchen BIOTIN PO Take by mouth.  . Calcium Citrate (CITRACAL PO) Take by mouth.  Mariane Baumgarten Calcium (STOOL SOFTENER PO) Take by mouth. Reported on 03/05/2015  . linaclotide (LINZESS) 145 MCG CAPS capsule Take 1 capsule (145 mcg total) by mouth daily before breakfast.  . vitamin C (ASCORBIC ACID) 500 MG tablet Take 500 mg by mouth daily.     Past Surgical History:  Procedure Laterality Date  . BREAST SURGERY Bilateral 02-21-2012   L-300cc,R-325cc  . CESAREAN SECTION    . INCISION AND DRAINAGE Right 01/16/2013   Procedure: INCISION AND DRAINAGE;  Surgeon: Tennis Must, MD;  Location: [facility];  Service: Orthopedics;  Laterality: Right;  . TUBAL LIGATION      Family History  Problem Relation Age of Onset  . Hypertension Mother   . Hypothyroidism Mother   . Cancer Father        ESOPHAGEAL AND BRAIN    New complaints: None today  Social history: Lives with husband. She is  planning on retiring in the next year.  Controlled substance contract: n/a    Review of Systems  Constitutional: Negative for diaphoresis.  Eyes: Negative for pain.  Respiratory: Negative for shortness of breath.   Cardiovascular: Negative for chest pain, palpitations and leg swelling.  Gastrointestinal: Negative for abdominal pain.  Endocrine: Negative for polydipsia.  Skin: Negative for rash.  Neurological: Negative for dizziness, weakness and headaches.  Hematological: Does not bruise/bleed easily.  All other systems reviewed and are negative.      Objective:   Physical Exam Vitals and nursing note reviewed.  Constitutional:      General: She is not in acute distress.    Appearance: Normal appearance. She is well-developed.  HENT:     Head: Normocephalic.     Nose: Nose normal.  Eyes:     Pupils: Pupils are equal, round, and reactive to light.  Neck:     Vascular: No carotid bruit or JVD.  Cardiovascular:     Rate and Rhythm: Normal rate and regular rhythm.     Heart sounds: Normal heart sounds.  Pulmonary:     Effort: Pulmonary effort is normal. No respiratory distress.     Breath sounds: Normal breath sounds. No wheezing or rales.  Chest:     Chest wall: No swelling.  Breasts:        Right: Normal.        Left: Normal.     Comments: bil breast implants Abdominal:     General: Bowel sounds are normal. There is no distension or abdominal bruit.     Palpations: Abdomen is soft. There is no hepatomegaly, splenomegaly, mass or pulsatile mass.     Tenderness: There is no abdominal tenderness.  Musculoskeletal:        General: Normal range of motion.     Cervical back: Normal range of motion and neck supple.  Lymphadenopathy:     Cervical: No cervical adenopathy.  Skin:    General: Skin is warm and dry.  Neurological:     Mental Status: She is alert and oriented to person, place, and time.     Deep Tendon Reflexes: Reflexes are normal and symmetric.   Psychiatric:        Behavior: Behavior normal.        Thought Content: Thought content normal.        Judgment: Judgment normal.    BP 120/75   Pulse 60   Temp (!) 96.8 F (36 C) (Temporal)   Resp 20   Ht 5' 6" (1.676 m)   Wt 140 lb (63.5 kg)   SpO2 100%   BMI 22.60 kg/m   Chest xray-[facility]-Mary-Margaret Hassell Done, FNP  Chest xray- no cardiopulmonary abnormalities- Preliminary reading by Ronnald Collum, FNP  [facility]       Assessment & Plan:  Elizabeth Nash comes in today with chief complaint of Annual Exam (No pap)   Diagnosis and orders addressed:  1. Annual physical exam - Urinalysis, Complete - DG Chest 2 View; Future - EKG 12-Lead - CBC with Differential/Platelet - CMP14+EGFR - Lipid panel - Thyroid Panel With TSH  2. GAD (generalized anxiety disorder) Stress management - ALPRAZolam (XANAX) 0.25 MG tablet; Take 1 tablet (0.25 mg total) by mouth as needed for anxiety.  Dispense: 30 tablet; Refill: 0  3. Chronic idiopathic constipation Continue linzess as needed.  4. Hyperplastic colonic polyp, unspecified part of colon - Ambulatory referral to Gastroenterology   Labs pending Health Maintenance reviewed Diet and exercise encouraged  Follow up plan: 1 year and prn   Mary-Margaret Hassell Done, FNP

## 2019-04-28 NOTE — Patient Instructions (Signed)
Exercising to Stay Healthy To become healthy and stay healthy, it is recommended that you do moderate-intensity and vigorous-intensity exercise. You can tell that you are exercising at a moderate intensity if your heart starts beating faster and you start breathing faster but can still hold a conversation. You can tell that you are exercising at a vigorous intensity if you are breathing much harder and faster and cannot hold a conversation while exercising. Exercising regularly is important. It has many health benefits, such as:  Improving overall fitness, flexibility, and endurance.  Increasing bone density.  Helping with weight control.  Decreasing body fat.  Increasing muscle strength.  Reducing stress and tension.  Improving overall health. How often should I exercise? Choose an activity that you enjoy, and set realistic goals. Your health care provider can help you make an activity plan that works for you. Exercise regularly as told by your health care provider. This may include:  Doing strength training two times a week, such as: ? Lifting weights. ? Using resistance bands. ? Push-ups. ? Sit-ups. ? Yoga.  Doing a certain intensity of exercise for a given amount of time. Choose from these options: ? A total of 150 minutes of moderate-intensity exercise every week. ? A total of 75 minutes of vigorous-intensity exercise every week. ? A mix of moderate-intensity and vigorous-intensity exercise every week. Children, pregnant women, people who have not exercised regularly, people who are overweight, and older adults may need to talk with a health care provider about what activities are safe to do. If you have a medical condition, be sure to talk with your health care provider before you start a new exercise program. What are some exercise ideas? Moderate-intensity exercise ideas include:  Walking 1 mile (1.6 km) in about 15  minutes.  Biking.  Hiking.  Golfing.  Dancing.  Water aerobics. Vigorous-intensity exercise ideas include:  Walking 4.5 miles (7.2 km) or more in about 1 hour.  Jogging or running 5 miles (8 km) in about 1 hour.  Biking 10 miles (16.1 km) or more in about 1 hour.  Lap swimming.  Roller-skating or in-line skating.  Cross-country skiing.  Vigorous competitive sports, such as football, basketball, and soccer.  Jumping rope.  Aerobic dancing. What are some everyday activities that can help me to get exercise?  Yard work, such as: ? Pushing a lawn mower. ? Raking and bagging leaves.  Washing your car.  Pushing a stroller.  Shoveling snow.  Gardening.  Washing windows or floors. How can I be more active in my day-to-day activities?  Use stairs instead of an elevator.  Take a walk during your lunch break.  If you drive, park your car farther away from your work or school.  If you take public transportation, get off one stop early and walk the rest of the way.  Stand up or walk around during all of your indoor phone calls.  Get up, stretch, and walk around every 30 minutes throughout the day.  Enjoy exercise with a friend. Support to continue exercising will help you keep a regular routine of activity. What guidelines can I follow while exercising?  Before you start a new exercise program, talk with your health care provider.  Do not exercise so much that you hurt yourself, feel dizzy, or get very short of breath.  Wear comfortable clothes and wear shoes with good support.  Drink plenty of water while you exercise to prevent dehydration or heat stroke.  Work out until your breathing   and your heartbeat get faster. Where to find more information  [place] Department of Health and Human Services: www.hhs.gov  Centers for Disease Control and Prevention (CDC): www.cdc.gov Summary  Exercising regularly is important. It will improve your overall fitness,  flexibility, and endurance.  Regular exercise also will improve your overall health. It can help you control your weight, reduce stress, and improve your bone density.  Do not exercise so much that you hurt yourself, feel dizzy, or get very short of breath.  Before you start a new exercise program, talk with your health care provider. This information is not intended to replace advice given to you by your health care provider. Make sure you discuss any questions you have with your health care provider. Document Revised: 02/02/2017 Document Reviewed: 01/11/2017 Elsevier Patient Education  2020 Elsevier Inc.  

## 2019-04-29 LAB — LIPID PANEL
Chol/HDL Ratio: 1.8 ratio (ref 0.0–4.4)
Cholesterol, Total: 193 mg/dL (ref 100–199)
HDL: 105 mg/dL (ref >39)
LDL Chol Calc (NIH): 76 mg/dL (ref 0–99)
Triglycerides: 67 mg/dL (ref 0–149)
VLDL Cholesterol Cal: 12 mg/dL (ref 5–40)

## 2019-04-29 LAB — CMP14+EGFR
ALT: 15 IU/L (ref 0–32)
AST: 18 IU/L (ref 0–40)
Albumin/Globulin Ratio: 1.4 (ref 1.2–2.2)
Albumin: 4.4 g/dL (ref 3.8–4.8)
Alkaline Phosphatase: 62 IU/L (ref 39–117)
BUN/Creatinine Ratio: 25 (ref 12–28)
BUN: 17 mg/dL (ref 8–27)
Bilirubin Total: 0.5 mg/dL (ref 0.0–1.2)
CO2: 23 mmol/L (ref 20–29)
Calcium: 9.2 mg/dL (ref 8.7–10.3)
Chloride: 103 mmol/L (ref 96–106)
Creatinine, Ser: 0.67 mg/dL (ref 0.57–1.00)
GFR calc Af Amer: 110 mL/min/{1.73_m2} (ref >59)
GFR calc non Af Amer: 95 mL/min/{1.73_m2} (ref >59)
Globulin, Total: 3.1 g/dL (ref 1.5–4.5)
Glucose: 83 mg/dL (ref 65–99)
Potassium: 3.8 mmol/L (ref 3.5–5.2)
Sodium: 140 mmol/L (ref 134–144)
Total Protein: 7.5 g/dL (ref 6.0–8.5)

## 2019-04-29 LAB — CBC WITH DIFFERENTIAL/PLATELET
Basophils Absolute: 0.1 10*3/uL (ref 0.0–0.2)
Basos: 1 %
EOS (ABSOLUTE): 0.3 10*3/uL (ref 0.0–0.4)
Eos: 5 %
Hematocrit: 38.9 % (ref 34.0–46.6)
Hemoglobin: 13.3 g/dL (ref 11.1–15.9)
Immature Grans (Abs): 0 10*3/uL (ref 0.0–0.1)
Immature Granulocytes: 0 %
Lymphocytes Absolute: 2.7 10*3/uL (ref 0.7–3.1)
Lymphs: 42 %
MCH: 31.3 pg (ref 26.6–33.0)
MCHC: 34.2 g/dL (ref 31.5–35.7)
MCV: 92 fL (ref 79–97)
Monocytes Absolute: 0.7 10*3/uL (ref 0.1–0.9)
Monocytes: 10 %
Neutrophils Absolute: 2.7 10*3/uL (ref 1.4–7.0)
Neutrophils: 42 %
Platelets: 220 10*3/uL (ref 150–450)
RBC: 4.25 x10E6/uL (ref 3.77–5.28)
RDW: 11.7 % (ref 11.7–15.4)
WBC: 6.5 10*3/uL (ref 3.4–10.8)

## 2019-04-29 LAB — THYROID PANEL WITH TSH
Free Thyroxine Index: 1.5 (ref 1.2–4.9)
T3 Uptake Ratio: 25 % (ref 24–39)
T4, Total: 6 ug/dL (ref 4.5–12.0)
TSH: 3.29 u[IU]/mL (ref 0.450–4.500)

## 2019-05-15 ENCOUNTER — Telehealth: Payer: Self-pay | Admitting: Nurse Practitioner

## 2019-05-15 DIAGNOSIS — K635 Polyp of colon: Secondary | ICD-10-CM

## 2019-05-15 NOTE — Telephone Encounter (Signed)
  REFERRAL REQUEST Telephone Note 05/15/2019  What type of referral do you need? Gastro  Have you been seen at our office for this problem? Patient had a Ref placed on 04/28/2019 for Gertie Fey but states she told nurse wrong location - it needed to be send to [facility] [place]. (Receiving Office closed Ref & that is why a new Ref needs to be placed) (Advise that they may need an appointment with their PCP before a referral can be done)  Is there a particular doctor or location that you prefer? [facility]  Patient notified that referrals can take up to a week or longer to process. If they haven't heard anything within a week they should call back and speak with the referral department.

## 2019-05-30 ENCOUNTER — Ambulatory Visit: Payer: Self-pay | Attending: Internal Medicine

## 2019-05-30 DIAGNOSIS — Z23 Encounter for immunization: Secondary | ICD-10-CM

## 2019-05-30 NOTE — Progress Notes (Signed)
   Covid-19 Vaccination Clinic  Name:  Elizabeth Nash    MRN: [id number] DOB: 02-26-1956  05/30/2019  Ms. Kallenberger was observed post Covid-19 immunization for 15 minutes without incident. She was provided with Vaccine Information Sheet and instruction to access the V-Safe system.   Ms. Schuring was instructed to call 911 with any severe reactions post vaccine: Marland Kitchen Difficulty breathing  . Swelling of face and throat  . A fast heartbeat  . A bad rash all over body  . Dizziness and weakness   Immunizations Administered    Name Date Dose VIS Date Route   Moderna COVID-19 Vaccine 05/30/2019  9:59 AM 0.5 mL 02/04/2019 Intramuscular   Manufacturer: Moderna   Lot: [id number]   [id number]

## 2019-06-23 ENCOUNTER — Other Ambulatory Visit: Payer: Self-pay | Admitting: Nurse Practitioner

## 2019-06-23 DIAGNOSIS — F411 Generalized anxiety disorder: Secondary | ICD-10-CM

## 2019-07-01 ENCOUNTER — Ambulatory Visit: Payer: Self-pay | Attending: Internal Medicine

## 2019-07-01 DIAGNOSIS — Z23 Encounter for immunization: Secondary | ICD-10-CM

## 2019-07-01 NOTE — Progress Notes (Signed)
   Covid-19 Vaccination Clinic  Name:  Elizabeth Nash    MRN: [id number] DOB: 15-Jan-1956  07/01/2019  [name] was observed post Covid-19 immunization for 30 minutes based on pre-vaccination screening without incident. She was provided with Vaccine Information Sheet and instruction to access the V-Safe system.   [name] was instructed to call 911 with any severe reactions post vaccine: Marland Kitchen Difficulty breathing  . Swelling of face and throat  . A fast heartbeat  . A bad rash all over body  . Dizziness and weakness   Immunizations Administered    Name Date Dose VIS Date Route   Moderna COVID-19 Vaccine 07/01/2019 12:47 PM 0.5 mL 02/2019 Intramuscular   Manufacturer: Moderna   Lot: [id number]   [id number]

## 2019-07-11 DIAGNOSIS — Z86010 Personal history of colonic polyps: Secondary | ICD-10-CM | POA: Diagnosis not present

## 2019-07-11 DIAGNOSIS — Z1211 Encounter for screening for malignant neoplasm of colon: Secondary | ICD-10-CM | POA: Diagnosis not present

## 2019-07-11 DIAGNOSIS — K648 Other hemorrhoids: Secondary | ICD-10-CM | POA: Diagnosis not present

## 2019-07-28 ENCOUNTER — Other Ambulatory Visit: Payer: Self-pay | Admitting: Nurse Practitioner

## 2019-07-28 DIAGNOSIS — F411 Generalized anxiety disorder: Secondary | ICD-10-CM

## 2019-08-29 ENCOUNTER — Other Ambulatory Visit: Payer: Self-pay | Admitting: Nurse Practitioner

## 2019-08-29 DIAGNOSIS — F411 Generalized anxiety disorder: Secondary | ICD-10-CM

## 2019-08-29 NOTE — Telephone Encounter (Signed)
Xanax denied-ntbs for refills on controlled substance

## 2019-11-04 ENCOUNTER — Ambulatory Visit: Payer: BC Managed Care – PPO | Admitting: Nurse Practitioner

## 2019-11-04 ENCOUNTER — Encounter: Payer: Self-pay | Admitting: Nurse Practitioner

## 2019-11-04 ENCOUNTER — Other Ambulatory Visit: Payer: Self-pay

## 2019-11-04 VITALS — BP 114/70 | HR 99 | Temp 97.5°F | Resp 20 | Ht 66.0 in | Wt 140.0 lb

## 2019-11-04 DIAGNOSIS — F411 Generalized anxiety disorder: Secondary | ICD-10-CM | POA: Diagnosis not present

## 2019-11-04 DIAGNOSIS — K5904 Chronic idiopathic constipation: Secondary | ICD-10-CM

## 2019-11-04 MED ORDER — LINACLOTIDE 145 MCG PO CAPS: ORAL_CAPSULE | ORAL | 5 refills | Status: DC

## 2019-11-04 MED ORDER — ALPRAZOLAM 0.25 MG PO TABS: 0.2500 mg | ORAL_TABLET | ORAL | 0 refills | Status: DC | PRN

## 2019-11-04 NOTE — Patient Instructions (Signed)

## 2019-11-04 NOTE — Progress Notes (Signed)
Subjective:    Patient ID: Elizabeth Nash, female    DOB: 08-11-1955, 64 y.o.   MRN: [id number]   Chief Complaint: Medical Management of Chronic Issues    HPI:  1. GAD (generalized anxiety disorder) Patient stays stressed. She has been on xanax for years.  GAD 7 : Generalized Anxiety Score 04/28/2019  Nervous, Anxious, on Edge 0  Control/stop worrying 0  Worry too much - different things 0  Trouble relaxing 1  Restless 0  Easily annoyed or irritable 0  Afraid - awful might happen 0  Total GAD 7 Score 1  Anxiety Difficulty Not difficult at all      2. Chronic idiopathic constipation She is on linzess which works well, but she can only take on the weekends because it really makes her go. She cannot take and work. She says it costs her 35$ to refill and she would like something cheaper. She takes dulcolax daily and she can only go on weekends when she takes linzess.    Outpatient Encounter Medications as of 11/04/2019  Medication Sig  . ALPRAZolam (XANAX) 0.25 MG tablet TAKE 1 TABLET (0.25 MG TOTAL) BY MOUTH AS NEEDED FOR ANXIETY.  Marland Kitchen BIOTIN PO Take by mouth.  . Calcium Citrate (CITRACAL PO) Take by mouth.  Mariane Baumgarten Calcium (STOOL SOFTENER PO) Take by mouth. Reported on 03/05/2015  . LINZESS 145 MCG CAPS capsule TAKE 1 CAPSULE (145 MCG TOTAL) BY MOUTH DAILY BEFORE BREAKFAST.  Marland Kitchen vitamin C (ASCORBIC ACID) 500 MG tablet Take 500 mg by mouth daily.     Past Surgical History:  Procedure Laterality Date  . BREAST SURGERY Bilateral 02-21-2012   L-300cc,R-325cc  . CESAREAN SECTION    . INCISION AND DRAINAGE Right 01/16/2013   Procedure: INCISION AND DRAINAGE;  Surgeon: Tennis Must, MD;  Location: [facility];  Service: Orthopedics;  Laterality: Right;  . TUBAL LIGATION      Family History  Problem Relation Age of Onset  . Hypertension Mother   . Hypothyroidism Mother   . Cancer Father        ESOPHAGEAL AND BRAIN    New complaints: None today  Social  history: Her daughter and grandson live with her  Controlled substance contract: 04/30/19    Review of Systems  Constitutional: Negative for diaphoresis.  Eyes: Negative for pain.  Respiratory: Negative for shortness of breath.   Cardiovascular: Negative for chest pain, palpitations and leg swelling.  Gastrointestinal: Negative for abdominal pain.  Endocrine: Negative for polydipsia.  Skin: Negative for rash.  Neurological: Negative for dizziness, weakness and headaches.  Hematological: Does not bruise/bleed easily.  All other systems reviewed and are negative.      Objective:   Physical Exam Vitals and nursing note reviewed.  Constitutional:      General: She is not in acute distress.    Appearance: Normal appearance. She is well-developed.  HENT:     Head: Normocephalic.     Nose: Nose normal.  Eyes:     Pupils: Pupils are equal, round, and reactive to light.  Neck:     Vascular: No carotid bruit or JVD.  Cardiovascular:     Rate and Rhythm: Normal rate and regular rhythm.     Heart sounds: Normal heart sounds.  Pulmonary:     Effort: Pulmonary effort is normal. No respiratory distress.     Breath sounds: Normal breath sounds. No wheezing or rales.  Chest:     Chest wall: No tenderness.  Abdominal:     General: Bowel sounds are normal. There is no distension or abdominal bruit.     Palpations: Abdomen is soft. There is no hepatomegaly, splenomegaly, mass or pulsatile mass.     Tenderness: There is no abdominal tenderness.  Musculoskeletal:        General: Normal range of motion.     Cervical back: Normal range of motion and neck supple.  Lymphadenopathy:     Cervical: No cervical adenopathy.  Skin:    General: Skin is warm and dry.  Neurological:     Mental Status: She is alert and oriented to person, place, and time.     Deep Tendon Reflexes: Reflexes are normal and symmetric.  Psychiatric:        Behavior: Behavior normal.        Thought Content: Thought  content normal.        Judgment: Judgment normal.    BP 114/70   Pulse 99   Temp (!) 97.5 F (36.4 C) (Temporal)   Resp 20   Ht 5\' 6"  (1.676 m)   Wt 140 lb (63.5 kg)   SpO2 100%   BMI 22.60 kg/m         Assessment & Plan:  Nohea Kras comes in today with chief complaint of Medical Management of Chronic Issues   Diagnosis and orders addressed:  1. GAD (generalized anxiety disorder) Stress management - ALPRAZolam (XANAX) 0.25 MG tablet; Take 1 tablet (0.25 mg total) by mouth as needed for anxiety.  Dispense: 30 tablet; Refill: 0  2. Chronic idiopathic constipation Patient is going to try miralax in apple juicedaily Will use linzess when needed - linaclotide (LINZESS) 145 MCG CAPS capsule; TAKE 1 CAPSULE (145 MCG TOTAL) BY MOUTH DAILY BEFORE BREAKFAST.  Dispense: 30 capsule; Refill: 5   Labs pending Health Maintenance reviewed Diet and exercise encouraged  Follow up plan: 6 months   Mary-Margaret Hassell Done, FNP

## 2019-11-20 ENCOUNTER — Other Ambulatory Visit: Payer: Self-pay | Admitting: Nurse Practitioner

## 2019-11-20 DIAGNOSIS — F411 Generalized anxiety disorder: Secondary | ICD-10-CM

## 2019-12-15 ENCOUNTER — Other Ambulatory Visit: Payer: Self-pay | Admitting: Nurse Practitioner

## 2019-12-15 DIAGNOSIS — F411 Generalized anxiety disorder: Secondary | ICD-10-CM

## 2020-01-15 ENCOUNTER — Other Ambulatory Visit: Payer: Self-pay | Admitting: Nurse Practitioner

## 2020-01-15 DIAGNOSIS — F411 Generalized anxiety disorder: Secondary | ICD-10-CM

## 2020-02-16 ENCOUNTER — Other Ambulatory Visit: Payer: Self-pay | Admitting: Nurse Practitioner

## 2020-02-16 DIAGNOSIS — F411 Generalized anxiety disorder: Secondary | ICD-10-CM

## 2020-02-20 DIAGNOSIS — Z23 Encounter for immunization: Secondary | ICD-10-CM | POA: Diagnosis not present

## 2020-03-11 ENCOUNTER — Telehealth: Payer: Self-pay

## 2020-03-11 NOTE — Telephone Encounter (Signed)
Patient called in c/o chest pain.  States it happened one day last week and happened today at work and lasted 20 mins.  States her nurse at work told her to follow up with MMM.  Advised patient to go to ER or urgent care to get evaluated today. Patient refused and states she does not have any chest pain now and wants to only see MMM.  Appointment scheduled with MMM 1/7.  Advised patient if she started having chest pain again she needs to seek medical antitension.  Patient verbalizes understanding.    FYI

## 2020-03-12 ENCOUNTER — Ambulatory Visit: Payer: BC Managed Care – PPO | Admitting: Nurse Practitioner

## 2020-03-12 ENCOUNTER — Encounter: Payer: Self-pay | Admitting: Nurse Practitioner

## 2020-03-12 ENCOUNTER — Other Ambulatory Visit: Payer: Self-pay

## 2020-03-12 VITALS — BP 129/75 | HR 62 | Temp 97.5°F | Resp 20 | Ht 66.0 in | Wt 141.0 lb

## 2020-03-12 DIAGNOSIS — R079 Chest pain, unspecified: Secondary | ICD-10-CM | POA: Diagnosis not present

## 2020-03-12 MED ORDER — NITROGLYCERIN 0.4 MG SL SUBL
0.4000 mg | SUBLINGUAL_TABLET | SUBLINGUAL | 3 refills | Status: AC | PRN
Start: 2020-03-12 — End: ?

## 2020-03-12 NOTE — Patient Instructions (Signed)

## 2020-03-12 NOTE — Progress Notes (Signed)
   Subjective:    Patient ID: Elizabeth Nash, female    DOB: 1955/06/13, 65 y.o.   MRN: [id number]   Chief Complaint: Chest Pain and Hyperlipidemia   HPI Patient called office yesterday c/o chest pain. She was advised to go to the ER but she refused. Wanted to come in office and be seen. She says the chest pan is in the middle of her chest and it feels heavy. She said it occurred 2 weeks ago and lasted about 10 minutes. Occurred again yesterday and lasted about 20 minutes. she denies any SOB or sweaty.  Her blood pressure yesterday during episode was 500 systolic.  She has had no chest pain today.   Review of Systems  Constitutional: Negative for diaphoresis.  Eyes: Negative for pain.  Respiratory: Negative for shortness of breath.   Cardiovascular: Positive for chest pain. Negative for palpitations and leg swelling.  Gastrointestinal: Negative for abdominal pain.  Endocrine: Negative for polydipsia.  Skin: Negative for rash.  Neurological: Negative for dizziness, weakness and headaches.  Hematological: Does not bruise/bleed easily.  All other systems reviewed and are negative.      Objective:   Physical Exam Vitals and nursing note reviewed.  Constitutional:      Appearance: She is well-developed and normal weight.  Cardiovascular:     Rate and Rhythm: Normal rate and regular rhythm.     Heart sounds: Normal heart sounds.  Pulmonary:     Effort: Pulmonary effort is normal.     Breath sounds: Normal breath sounds.  Abdominal:     General: Bowel sounds are normal.     Palpations: Abdomen is soft.  Skin:    General: Skin is warm and dry.  Neurological:     Mental Status: She is alert.    BP 129/75   Pulse 62   Temp (!) 97.5 F (36.4 C) (Temporal)   Resp 20   Ht 5\' 6"  (1.676 m)   Wt 141 lb (64 kg)   SpO2 100%   BMI 22.76 kg/m   EKG- sinus bradycardia Hassell Done, FNP        Assessment & Plan:  Elizabeth Nash in today with chief complaint of Chest Pain and  Hyperlipidemia   1. Chest pain, unspecified type Avoid spicy and fatty foods Add baby asa to meds daily If CP before seeing cardio - take nitro. May repeat in 5 minutes If needed and go to the ER - EKG 12-Lead - nitroGLYCERIN (NITROSTAT) 0.4 MG SL tablet; Place 1 tablet (0.4 mg total) under the tongue every 5 (five) minutes as needed for chest pain.  Dispense: 50 tablet; Refill: 3 - Ambulatory referral to Cardiology    The above assessment and management plan was discussed with the patient. The patient verbalized understanding of and has agreed to the management plan. Patient is aware to call the clinic if symptoms persist or worsen. Patient is aware when to return to the clinic for a follow-up visit. Patient educated on when it is appropriate to go to the emergency department.   Mary-Margaret Hassell Done, FNP

## 2020-03-14 NOTE — Progress Notes (Signed)
Cardiology Office Note:   Date:  03/17/2020  NAME:  Elizabeth Nash    MRN: [id number] DOB:  Dec 29, 1955   PCP:  Chevis Pretty, FNP  Cardiologist:  No primary care provider on file.   Referring MD: Chevis Pretty, *   Chief Complaint  Patient presents with  . Chest Pain   History of Present Illness:   Elizabeth Nash is a 65 y.o. female with a hx of anxiety, HLD who is being seen today for the evaluation of chest pain at the request of Chevis Pretty, FNP. She presents for evaluation of two chest pain episodes. She apparently received her booster in December. On 02/21/2020 shortly after the booster shot within 1 week she developed 10 minutes of chest pressure. She reports she was cooking breakfast and had pressure in the center of her chest. The pressure lasted 10 minutes and resolved without intervention. No identifiable triggers. Nothing made it worse or better. Roughly 2 weeks later while at work. She developed 20 minutes of chest pressure while working on the line. 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 She reports the chest pressure resolved without intervention. Nothing she did made it better and nothing made it worse. She was evaluated by her primary care physician and given a prescription for nitroglycerin. Her EKG today in office demonstrates sinus bradycardia heart rate 55 without acute ischemic changes or evidence of prior infarction. She is a former tobacco abuser. She quit 40 years ago. Most recent lipid profile shows a total cholesterol 193, HDL 105, LDL 76, triglycerides 67. She does not consume alcohol. No illicit drugs. She reports she does not do any regular exercise but works on a somewhat regular 90 fracture and does quite a bit of work. No chest pain or pressure normally. She has had no further episodes. No fevers chills or cough reported. No symptoms concerning for pericarditis. No association with food. There is no strong family history of heart  disease.  T chol 193, HDL 105, LDL 76, TG 67  Past Medical History: Past Medical History:  Diagnosis Date  . Anxiety   . Arthritis   . Atypical nevus 01/18/2001   left hip post outer (moderate)  . Atypical nevus 05/14/2003   right tricep (mild)  . 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 Insomnia   . 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 shin (well diff)    Past Surgical History: Past Surgical History:  Procedure Laterality Date  . BREAST SURGERY Bilateral 02-21-2012   L-300cc,R-325cc  . CESAREAN SECTION    . INCISION AND DRAINAGE Right 01/16/2013   Procedure: INCISION AND DRAINAGE;  Surgeon: Tennis Must, MD;  Location: [place];  Service: Orthopedics;  Laterality: Right;  . TUBAL LIGATION      Current Medications: Current Meds  Medication Sig  . metoprolol tartrate (LOPRESSOR) 25 MG tablet Take 1 tablet by mouth once for procedure.     Allergies:    Codeine, Penicillins, and Sulfa antibiotics   Social History: Social History   Socioeconomic History  . Marital status: Divorced    Spouse name: Not on file  . Number of children: 2  . Years of education: Not on file  . Highest education level: Not on file  Occupational History  . Not on file  Tobacco Use  . Smoking status: Former Smoker    Years:  8.00    Quit date: 12/19/1987    Years since quitting: 32.2  . Smokeless tobacco: Never Used  Substance and Sexual Activity  . Alcohol use: Yes    Comment: rare  . Drug use: No  . Sexual activity: Not on file  Other Topics Concern  . Not on file  Social History Narrative  . Not on file   Social Determinants of Health   Financial Resource Strain: Not on file  Food Insecurity: Not on file  Transportation Needs: Not on file  Physical Activity: Not on file  Stress: Not on file  Social Connections: Not on file    Family  History: The patient's family history includes Cancer in her father; Hypertension in her mother; Hypothyroidism in her mother.  ROS:   All other ROS reviewed and negative. Pertinent positives noted in the HPI.     EKGs/Labs/Other Studies Reviewed:   The following studies were personally reviewed by me today:  EKG:  EKG is ordered today.  The ekg ordered today demonstrates SB 55 bpm, no acute ischemic changes, no infarction, and was personally reviewed by me.   Recent Labs: 04/28/2019: ALT 15; BUN 17; Creatinine, Ser 0.67; Hemoglobin 13.3; Platelets 220; Potassium 3.8; Sodium 140; TSH 3.290   Recent Lipid Panel    Component Value Date/Time   CHOL 193 04/28/2019 1043   TRIG 67 04/28/2019 1043   HDL 105 04/28/2019 1043   CHOLHDL 1.8 04/28/2019 1043   LDLCALC 76 04/28/2019 1043    Physical Exam:   VS:  BP 124/72   Pulse (!) 55   Ht 5' 7" (1.702 m)   Wt 140 lb 12.8 oz (63.9 kg)   SpO2 99%   BMI 22.05 kg/m    Wt Readings from Last 3 Encounters:  03/17/20 140 lb 12.8 oz (63.9 kg)  03/12/20 141 lb (64 kg)  11/04/19 140 lb (63.5 kg)    General: Well nourished, well developed, in no acute distress Head: Atraumatic, normal size  Eyes: PEERLA, EOMI  Neck: Supple, no JVD Endocrine: No thryomegaly Cardiac: Normal S1, S2; RRR; no murmurs, rubs, or gallops Lungs: Clear to auscultation bilaterally, no wheezing, rhonchi or rales  Abd: Soft, nontender, no hepatomegaly  Ext: No edema, pulses 2+ Musculoskeletal: No deformities, BUE and BLE strength normal and equal Skin: Warm and dry, no rashes   Neuro: Alert and oriented to person, place, time, and situation, CNII-XII grossly intact, no focal deficits  Psych: Normal mood and affect   ASSESSMENT:   Elizabeth Nash is a 65 y.o. female who presents for the following: 1. Chest pain, unspecified type   2. Mixed hyperlipidemia     PLAN:   1. Chest pain, unspecified type 2. Mixed hyperlipidemia -Atypical chest pain. Had pressure at rest.  Occurred after the COVID-19 booster shot. EKG today without ischemic changes or evidence of infarction. Symptoms are really not concerning for angina. She has exceedingly high good cholesterol. Her HDL cholesterol 105. No significant alcohol abuse reported. I highly suspect this is noncardiac chest pain. I recommended we proceed with a coronary CTA to further exclude this. She will take 25 mg of metoprolol tartrate 2 hours before the scan. She will give [place] a BMP and TSH this morning. She will see me 3 months after this appointment.  Disposition: Return in about 3 months (around 06/15/2020).  Medication Adjustments/Labs and Tests Ordered: Current medicines are reviewed at length with the patient today.  Concerns regarding medicines are outlined above.  Orders Placed  This Encounter  Procedures  . CT CORONARY MORPH W/CTA COR W/SCORE W/CA W/CM &/OR WO/CM  . CT CORONARY FRACTIONAL FLOW RESERVE DATA PREP  . CT CORONARY FRACTIONAL FLOW RESERVE FLUID ANALYSIS  . Basic metabolic panel  . TSH  . EKG 12-Lead   Meds ordered this encounter  Medications  . metoprolol tartrate (LOPRESSOR) 25 MG tablet    Sig: Take 1 tablet by mouth once for procedure.    Dispense:  1 tablet    Refill:  0    Patient Instructions  Medication Instructions:  Take Metoprolol two hours before the CT scan when scheduled.  *If you need a refill on your cardiac medications before your next appointment, please call your pharmacy*   Lab Work: BMET, TSH today   If you have labs (blood work) drawn today and your tests are completely normal, you will receive your results only by: Marland Kitchen MyChart Message (if you have MyChart) OR . A paper copy in the mail If you have any lab test that is abnormal or we need to change your treatment, we will call you to review the results.   Testing/Procedures: Your physician has requested that you have cardiac CT. Cardiac computed tomography (CT) is a painless test that uses an x-ray machine to  take clear, detailed pictures of your heart. For further information please visit HugeFiesta.[email or web address]. Please follow instruction sheet as given.      Follow-Up: At [facility], you and your health needs are our priority.  As part of our continuing mission to provide you with exceptional heart care, we have created designated Provider Care Teams.  These Care Teams include your primary Cardiologist (physician) and Advanced Practice Providers (APPs -  Physician Assistants and Nurse Practitioners) who all work together to provide you with the care you need, when you need it.  We recommend signing up for the patient portal called "MyChart".  Sign up information is provided on this After Visit Summary.  MyChart is used to connect with patients for Virtual Visits (Telemedicine).  Patients are able to view lab/test results, encounter notes, upcoming appointments, etc.  Non-urgent messages can be sent to your provider as well.   To learn more about what you can do with MyChart, go to [email or web address].    Your next appointment:   3 month(s)  The format for your next appointment:   In Person  Provider:   Eleonore Chiquito, MD   Other Instructions Your cardiac CT will be scheduled at one of the below locations:   [facility] [address] [place], [place] [address] [phone]  If scheduled at [facility], please arrive at the [facility] main entrance of [facility] 30 minutes prior to test start time. Proceed to the [facility] Radiology Department (first floor) to check-in and test prep.   Please follow these instructions carefully (unless otherwise directed):  Hold all erectile dysfunction medications at least 3 days (72 hrs) prior to test.  On the Night Before the Test: . Be sure to Drink plenty of water. . Do not consume any caffeinated/decaffeinated beverages or chocolate 12 hours prior to your test. . Do not take any antihistamines 12 hours  prior to your test.   On the Day of the Test: . Drink plenty of water. Do not drink any water within one hour of the test. . Do not eat any food 4 hours prior to the test. . You may take your regular medications prior to the test.  .  Take metoprolol (Lopressor) two hours prior to test. . HOLD Furosemide/Hydrochlorothiazide morning of the test. . FEMALES- please wear underwire-free bra if available  After the Test: . Drink plenty of water. . After receiving IV contrast, you may experience a mild flushed feeling. This is normal. . On occasion, you may experience a mild rash up to 24 hours after the test. This is not dangerous. If this occurs, you can take Benadryl 25 mg and increase your fluid intake. . If you experience trouble breathing, this can be serious. If it is severe call 911 IMMEDIATELY. If it is mild, please call our office. . If you take any of these medications: Glipizide/Metformin, Avandament, Glucavance, please do not take 48 hours after completing test unless otherwise instructed.   Once we have confirmed authorization from your insurance company, we will call you to set up a date and time for your test. Based on how quickly your insurance processes prior authorizations requests, please allow up to 4 weeks to be contacted for scheduling your Cardiac CT appointment. Be advised that routine Cardiac CT appointments could be scheduled as many as 8 weeks after your provider has ordered it.  For non-scheduling related questions, please contact the cardiac imaging nurse navigator should you have any questions/concerns: Marchia Bond, Cardiac Imaging Nurse Navigator Burley Saver, Interim Cardiac Imaging Nurse [place] and Vascular Services Direct Office Dial: [phone]   For scheduling needs, including cancellations and rescheduling, please call [place], [phone].        Signed, Addison Naegeli. Audie Box, [place]  [address]  [place], [place] [address] [phone]  03/17/2020 1:33 PM

## 2020-03-15 ENCOUNTER — Ambulatory Visit: Payer: Self-pay | Admitting: Cardiology

## 2020-03-17 ENCOUNTER — Ambulatory Visit (INDEPENDENT_AMBULATORY_CARE_PROVIDER_SITE_OTHER): Payer: BC Managed Care – PPO | Admitting: Cardiovascular Disease

## 2020-03-17 ENCOUNTER — Other Ambulatory Visit: Payer: Self-pay

## 2020-03-17 ENCOUNTER — Other Ambulatory Visit: Payer: Self-pay | Admitting: Nurse Practitioner

## 2020-03-17 ENCOUNTER — Encounter: Payer: Self-pay | Admitting: Cardiovascular Disease

## 2020-03-17 VITALS — BP 124/72 | HR 55 | Ht 67.0 in | Wt 140.8 lb

## 2020-03-17 DIAGNOSIS — E782 Mixed hyperlipidemia: Secondary | ICD-10-CM

## 2020-03-17 DIAGNOSIS — R079 Chest pain, unspecified: Secondary | ICD-10-CM | POA: Diagnosis not present

## 2020-03-17 DIAGNOSIS — F411 Generalized anxiety disorder: Secondary | ICD-10-CM

## 2020-03-17 MED ORDER — METOPROLOL TARTRATE 25 MG PO TABS: ORAL_TABLET | ORAL | 0 refills | Status: DC

## 2020-03-17 NOTE — Patient Instructions (Signed)
Medication Instructions:  Take Metoprolol two hours before the CT scan when scheduled.  *If you need a refill on your cardiac medications before your next appointment, please call your pharmacy*   Lab Work: BMET, TSH today   If you have labs (blood work) drawn today and your tests are completely normal, you will receive your results only by: Marland Kitchen MyChart Message (if you have MyChart) OR . A paper copy in the mail If you have any lab test that is abnormal or we need to change your treatment, we will call you to review the results.   Testing/Procedures: Your physician has requested that you have cardiac CT. Cardiac computed tomography (CT) is a painless test that uses an x-ray machine to take clear, detailed pictures of your heart. For further information please visit HugeFiesta.[email or web address]. Please follow instruction sheet as given.      Follow-Up: At [facility], you and your health needs are our priority.  As part of our continuing mission to provide you with exceptional heart care, we have created designated Provider Care Teams.  These Care Teams include your primary Cardiologist (physician) and Advanced Practice Providers (APPs -  Physician Assistants and Nurse Practitioners) who all work together to provide you with the care you need, when you need it.  We recommend signing up for the patient portal called "MyChart".  Sign up information is provided on this After Visit Summary.  MyChart is used to connect with patients for Virtual Visits (Telemedicine).  Patients are able to view lab/test results, encounter notes, upcoming appointments, etc.  Non-urgent messages can be sent to your provider as well.   To learn more about what you can do with MyChart, go to [email or web address].    Your next appointment:   3 month(s)  The format for your next appointment:   In Person  Provider:   Eleonore Chiquito, MD   Other Instructions Your cardiac CT will be scheduled at one of the below  locations:   [facility] [address] [place], [place] [address] [phone]  If scheduled at [facility], please arrive at the [facility] main entrance of [facility] 30 minutes prior to test start time. Proceed to the [facility] Radiology Department (first floor) to check-in and test prep.   Please follow these instructions carefully (unless otherwise directed):  Hold all erectile dysfunction medications at least 3 days (72 hrs) prior to test.  On the Night Before the Test: . Be sure to Drink plenty of water. . Do not consume any caffeinated/decaffeinated beverages or chocolate 12 hours prior to your test. . Do not take any antihistamines 12 hours prior to your test.   On the Day of the Test: . Drink plenty of water. Do not drink any water within one hour of the test. . Do not eat any food 4 hours prior to the test. . You may take your regular medications prior to the test.  . Take metoprolol (Lopressor) two hours prior to test. . HOLD Furosemide/Hydrochlorothiazide morning of the test. . FEMALES- please wear underwire-free bra if available  After the Test: . Drink plenty of water. . After receiving IV contrast, you may experience a mild flushed feeling. This is normal. . On occasion, you may experience a mild rash up to 24 hours after the test. This is not dangerous. If this occurs, you can take Benadryl 25 mg and increase your fluid intake. . If you experience trouble breathing, this can be serious. If  it is severe call 911 IMMEDIATELY. If it is mild, please call our office. . If you take any of these medications: Glipizide/Metformin, Avandament, Glucavance, please do not take 48 hours after completing test unless otherwise instructed.   Once we have confirmed authorization from your insurance company, we will call you to set up a date and time for your test. Based on how quickly your insurance processes prior authorizations requests, please  allow up to 4 weeks to be contacted for scheduling your Cardiac CT appointment. Be advised that routine Cardiac CT appointments could be scheduled as many as 8 weeks after your provider has ordered it.  For non-scheduling related questions, please contact the cardiac imaging nurse navigator should you have any questions/concerns: Marchia Bond, Cardiac Imaging Nurse Navigator Burley Saver, Interim Cardiac Imaging Nurse [place] and Vascular Services Direct Office Dial: [phone]   For scheduling needs, including cancellations and rescheduling, please call [place], [phone].

## 2020-03-18 LAB — BASIC METABOLIC PANEL
BUN/Creatinine Ratio: 26 (ref 12–28)
BUN: 17 mg/dL (ref 8–27)
CO2: 25 mmol/L (ref 20–29)
Calcium: 9.5 mg/dL (ref 8.7–10.3)
Chloride: 102 mmol/L (ref 96–106)
Creatinine, Ser: 0.66 mg/dL (ref 0.57–1.00)
GFR calc Af Amer: 109 mL/min/{1.73_m2} (ref >59)
GFR calc non Af Amer: 95 mL/min/{1.73_m2} (ref >59)
Glucose: 88 mg/dL (ref 65–99)
Potassium: 5 mmol/L (ref 3.5–5.2)
Sodium: 140 mmol/L (ref 134–144)

## 2020-03-18 LAB — TSH: TSH: 5.03 u[IU]/mL — ABNORMAL HIGH (ref 0.450–4.500)

## 2020-03-19 NOTE — Progress Notes (Signed)
Pt is returning nurse call about labs

## 2020-04-07 ENCOUNTER — Other Ambulatory Visit: Payer: Self-pay | Admitting: *Deleted

## 2020-04-07 DIAGNOSIS — R7989 Other specified abnormal findings of blood chemistry: Secondary | ICD-10-CM

## 2020-04-09 ENCOUNTER — Telehealth (HOSPITAL_COMMUNITY): Payer: Self-pay | Admitting: *Deleted

## 2020-04-09 NOTE — Telephone Encounter (Signed)
Pt returning call regarding upcoming cardiac imaging study; pt verbalizes understanding of appt date/time, parking situation and where to check in, pre-test NPO status, and verified current allergies; name and call back number provided for further questions should they arise  Jun Osment RN Navigator Cardiac Imaging [place] [facility] [phone] office [phone] cell  

## 2020-04-09 NOTE — Telephone Encounter (Signed)
Attempted to call patient regarding upcoming cardiac CT appointment. Called both mobile and home numbers without answer from pt.  Gordy Clement RN Navigator Cardiac Imaging [facility] Heart and Vascular Services [phone] Office [phone] Cell

## 2020-04-12 ENCOUNTER — Other Ambulatory Visit (HOSPITAL_COMMUNITY): Payer: BC Managed Care – PPO

## 2020-04-12 ENCOUNTER — Ambulatory Visit (HOSPITAL_COMMUNITY)
Admission: RE | Admit: 2020-04-12 | Discharge: 2020-04-12 | Disposition: A | Discharge status: Home / Self Care | Payer: BC Managed Care – PPO | Source: Ambulatory Visit | Attending: Cardiovascular Disease | Admitting: Cardiovascular Disease

## 2020-04-12 ENCOUNTER — Other Ambulatory Visit: Payer: Self-pay

## 2020-04-12 DIAGNOSIS — R079 Chest pain, unspecified: Secondary | ICD-10-CM | POA: Insufficient documentation

## 2020-04-12 MED ORDER — NITROGLYCERIN 0.4 MG SL SUBL
SUBLINGUAL_TABLET | SUBLINGUAL | Status: AC
  Filled 2020-04-12: qty 2

## 2020-04-12 MED ORDER — NITROGLYCERIN 0.4 MG SL SUBL
0.8000 mg | SUBLINGUAL_TABLET | Freq: Once | SUBLINGUAL | Status: AC | PRN
  Administered 2020-04-12: 0.8 mg via SUBLINGUAL

## 2020-04-12 MED ORDER — IOHEXOL 350 MG/ML SOLN
80.0000 mL | Freq: Once | INTRAVENOUS | Status: AC | PRN
  Administered 2020-04-12: 80 mL via INTRAVENOUS

## 2020-04-13 ENCOUNTER — Other Ambulatory Visit: Payer: Self-pay | Admitting: Nurse Practitioner

## 2020-04-13 DIAGNOSIS — F411 Generalized anxiety disorder: Secondary | ICD-10-CM

## 2020-04-17 ENCOUNTER — Other Ambulatory Visit: Payer: Self-pay | Admitting: Nurse Practitioner

## 2020-04-17 DIAGNOSIS — F411 Generalized anxiety disorder: Secondary | ICD-10-CM

## 2020-04-20 ENCOUNTER — Telehealth: Payer: Self-pay | Admitting: Cardiovascular Disease

## 2020-04-20 NOTE — Telephone Encounter (Signed)
Elizabeth Nash is calling requesting her CT results. Please advise.

## 2020-04-20 NOTE — Telephone Encounter (Signed)
Spoke with patient regarding the following results. Patient made aware and patient verbalized understanding.   Edit Comments Add Notifications    Mrs. Desanctis:  Your CT scan shows your heart arteries are normal. This means the symptoms you are having are not related to your heart. Overall you are very low risk for developing heart disease in the future. I think this is reassuring findings. I have forwarded these results to your primary care physician as well.

## 2020-04-21 ENCOUNTER — Other Ambulatory Visit: Payer: Self-pay | Admitting: Nurse Practitioner

## 2020-04-21 DIAGNOSIS — F411 Generalized anxiety disorder: Secondary | ICD-10-CM

## 2020-04-21 NOTE — Telephone Encounter (Signed)
  Prescription Request  04/21/2020  What is the name of the medication or equipment? Xanax  Have you contacted your pharmacy to request a refill? (if applicable) yes  Which pharmacy would you like this sent to? [facility] in [place]   Patient notified that their request is being sent to the clinical staff for review and that they should receive a response within 2 business days.

## 2020-04-21 NOTE — Telephone Encounter (Signed)
Thank you, updated results.

## 2020-04-23 NOTE — Telephone Encounter (Signed)
Aware refill sent to pharmacy  Pt has appt on 05/03/20 at 12, would like to come early for labs, there is one for her TSH can other orders be placed too

## 2020-05-03 ENCOUNTER — Ambulatory Visit: Payer: Self-pay | Admitting: Nurse Practitioner

## 2020-05-07 ENCOUNTER — Other Ambulatory Visit: Payer: Self-pay

## 2020-05-07 ENCOUNTER — Encounter: Payer: Self-pay | Admitting: Nurse Practitioner

## 2020-05-07 ENCOUNTER — Ambulatory Visit: Payer: BC Managed Care – PPO | Admitting: Nurse Practitioner

## 2020-05-07 VITALS — BP 145/82 | HR 64 | Temp 97.9°F | Resp 20 | Ht 67.0 in | Wt 144.0 lb

## 2020-05-07 DIAGNOSIS — Z0000 Encounter for general adult medical examination without abnormal findings: Secondary | ICD-10-CM

## 2020-05-07 DIAGNOSIS — F411 Generalized anxiety disorder: Secondary | ICD-10-CM

## 2020-05-07 DIAGNOSIS — Z0001 Encounter for general adult medical examination with abnormal findings: Secondary | ICD-10-CM | POA: Diagnosis not present

## 2020-05-07 DIAGNOSIS — K5904 Chronic idiopathic constipation: Secondary | ICD-10-CM

## 2020-05-07 LAB — URINALYSIS, COMPLETE
Bilirubin, UA: NEGATIVE
Glucose, UA: NEGATIVE
Ketones, UA: NEGATIVE
Leukocytes,UA: NEGATIVE
Nitrite, UA: NEGATIVE
Protein,UA: NEGATIVE
RBC, UA: NEGATIVE
Specific Gravity, UA: 1.015 (ref 1.005–1.030)
Urobilinogen, Ur: 0.2 mg/dL (ref 0.2–1.0)
pH, UA: 7 (ref 5.0–7.5)

## 2020-05-07 LAB — MICROSCOPIC EXAMINATION
Bacteria, UA: NONE SEEN
Epithelial Cells (non renal): NONE SEEN /hpf (ref 0–10)
RBC, Urine: NONE SEEN /hpf (ref 0–2)
WBC, UA: NONE SEEN /hpf (ref 0–5)

## 2020-05-07 MED ORDER — ALPRAZOLAM 0.25 MG PO TABS: 0.2500 mg | ORAL_TABLET | Freq: Two times a day (BID) | ORAL | 5 refills | Status: DC | PRN

## 2020-05-07 MED ORDER — LINACLOTIDE 145 MCG PO CAPS: ORAL_CAPSULE | ORAL | 5 refills | Status: DC

## 2020-05-07 NOTE — Progress Notes (Signed)
Subjective:    Patient ID: Elizabeth Nash, female    DOB: 10-11-1955, 65 y.o.   MRN: [id number]   Chief Complaint: Annual Exam (No pap. Only wants breast exam and urine)    HPI:  1. Annual physical exam No PAP.  2. Chronic idiopathic constipation Is on linzess which helps. She has 2-3 bowel movements a week  3. GAD (generalized anxiety disorder) Is on xanax on an as needed basis.        Outpatient Encounter Medications as of 05/07/2020  Medication Sig  . ALPRAZolam (XANAX) 0.25 MG tablet TAKE 1 TABLET BY MOUTH AS NEEDED FOR ANXIETY  . BIOTIN PO Take by mouth.  . Calcium Citrate (CITRACAL PO) Take by mouth.  Elizabeth Nash Calcium (STOOL SOFTENER PO) Take by mouth. Reported on 03/05/2015  . linaclotide (LINZESS) 145 MCG CAPS capsule TAKE 1 CAPSULE (145 MCG TOTAL) BY MOUTH DAILY BEFORE BREAKFAST.  Marland Kitchen nitroGLYCERIN (NITROSTAT) 0.4 MG SL tablet Place 1 tablet (0.4 mg total) under the tongue every 5 (five) minutes as needed for chest pain.  . vitamin C (ASCORBIC ACID) 500 MG tablet Take 500 mg by mouth daily.    Past Surgical History:  Procedure Laterality Date  . BREAST SURGERY Bilateral 02-21-2012   L-300cc,R-325cc  . CESAREAN SECTION    . INCISION AND DRAINAGE Right 01/16/2013   Procedure: INCISION AND DRAINAGE;  Surgeon: Elizabeth Must, MD;  Location: [place];  Service: Orthopedics;  Laterality: Right;  . TUBAL LIGATION      Family History  Problem Relation Age of Onset  . Hypertension Mother   . Hypothyroidism Mother   . Cancer Father        ESOPHAGEAL AND BRAIN    New complaints: None today  Social history: Lives with her husband. Is retiring in 2 months  Controlled substance contract: n/a    Review of Systems  Constitutional: Negative for diaphoresis.  Eyes: Negative for pain.  Respiratory: Negative for shortness of breath.   Cardiovascular: Negative for chest pain, palpitations and leg swelling.  Gastrointestinal: Negative for abdominal  pain.  Endocrine: Negative for polydipsia.  Skin: Negative for rash.  Neurological: Negative for dizziness, weakness and headaches.  Hematological: Does not bruise/bleed easily.  All other systems reviewed and are negative.      Objective:   Physical Exam Vitals and nursing note reviewed.  Constitutional:      General: She is not in acute distress.    Appearance: Normal appearance. She is well-developed and well-nourished.  HENT:     Head: Normocephalic.     Nose: Nose normal.     Mouth/Throat:     Mouth: Oropharynx is clear and moist.  Eyes:     Extraocular Movements: EOM normal.     Pupils: Pupils are equal, round, and reactive to light.  Neck:     Vascular: No carotid bruit or JVD.  Cardiovascular:     Rate and Rhythm: Normal rate and regular rhythm.     Pulses: Intact distal pulses.     Heart sounds: Normal heart sounds.  Pulmonary:     Effort: Pulmonary effort is normal. No respiratory distress.     Breath sounds: Normal breath sounds. No wheezing or rales.  Chest:     Chest wall: No tenderness.  Abdominal:     General: Bowel sounds are normal. There is no distension or abdominal bruit. Aorta is normal.     Palpations: Abdomen is soft. There is no hepatomegaly, splenomegaly, mass  or pulsatile mass.     Tenderness: There is no abdominal tenderness.  Musculoskeletal:        General: No edema. Normal range of motion.     Cervical back: Normal range of motion and neck supple.  Lymphadenopathy:     Cervical: No cervical adenopathy.  Skin:    General: Skin is warm and dry.  Neurological:     Mental Status: She is alert and oriented to person, place, and time.     Deep Tendon Reflexes: Reflexes are normal and symmetric.  Psychiatric:        Mood and Affect: Mood and affect normal.        Behavior: Behavior normal.        Thought Content: Thought content normal.        Judgment: Judgment normal.      BP (!) 145/82   Pulse 64   Temp 97.9 F (36.6 C) (Temporal)    Resp 20   Ht 5' 7" (1.702 m)   Wt 144 lb (65.3 kg)   SpO2 97%   BMI 22.55 kg/m      Assessment & Plan:  Elizabeth Nash comes in today with chief complaint of Annual Exam (No pap. Only wants breast exam and urine)   Diagnosis and orders addressed:  1. Annual physical exam Patient will schedule mammogram - CBC with Differential/Platelet - CMP14+EGFR - Lipid panel - Thyroid Panel With TSH  2. Chronic idiopathic constipation Increase fiber in diet - linaclotide (LINZESS) 145 MCG CAPS capsule; TAKE 1 CAPSULE (145 MCG TOTAL) BY MOUTH DAILY BEFORE BREAKFAST.  Dispense: 30 capsule; Refill: 5  3. GAD (generalized anxiety disorder) Stress management - ALPRAZolam (XANAX) 0.25 MG tablet; Take 1 tablet (0.25 mg total) by mouth 2 (two) times daily as needed for anxiety.  Dispense: 60 tablet; Refill: 5   Labs pending Health Maintenance reviewed Diet and exercise encouraged  Follow up plan: 6 months   Elizabeth Hassell Done, FNP

## 2020-05-07 NOTE — Patient Instructions (Signed)
Exercising to Stay Healthy To become healthy and stay healthy, it is recommended that you do moderate-intensity and vigorous-intensity exercise. You can tell that you are exercising at a moderate intensity if your heart starts beating faster and you start breathing faster but can still hold a conversation. You can tell that you are exercising at a vigorous intensity if you are breathing much harder and faster and cannot hold a conversation while exercising. Exercising regularly is important. It has many health benefits, such as:  Improving overall fitness, flexibility, and endurance.  Increasing bone density.  Helping with weight control.  Decreasing body fat.  Increasing muscle strength.  Reducing stress and tension.  Improving overall health. How often should I exercise? Choose an activity that you enjoy, and set realistic goals. Your health care provider can help you make an activity plan that works for you. Exercise regularly as told by your health care provider. This may include:  Doing strength training two times a week, such as: ? Lifting weights. ? Using resistance bands. ? Push-ups. ? Sit-ups. ? Yoga.  Doing a certain intensity of exercise for a given amount of time. Choose from these options: ? A total of 150 minutes of moderate-intensity exercise every week. ? A total of 75 minutes of vigorous-intensity exercise every week. ? A mix of moderate-intensity and vigorous-intensity exercise every week. Children, pregnant women, people who have not exercised regularly, people who are overweight, and older adults may need to talk with a health care provider about what activities are safe to do. If you have a medical condition, be sure to talk with your health care provider before you start a new exercise program. What are some exercise ideas? Moderate-intensity exercise ideas include:  Walking 1 mile (1.6 km) in about 15  minutes.  Biking.  Hiking.  Golfing.  Dancing.  Water aerobics. Vigorous-intensity exercise ideas include:  Walking 4.5 miles (7.2 km) or more in about 1 hour.  Jogging or running 5 miles (8 km) in about 1 hour.  Biking 10 miles (16.1 km) or more in about 1 hour.  Lap swimming.  Roller-skating or in-line skating.  Cross-country skiing.  Vigorous competitive sports, such as football, basketball, and soccer.  Jumping rope.  Aerobic dancing.   What are some everyday activities that can help me to get exercise?  Yard work, such as: ? Pushing a lawn mower. ? Raking and bagging leaves.  Washing your car.  Pushing a stroller.  Shoveling snow.  Gardening.  Washing windows or floors. How can I be more active in my day-to-day activities?  Use stairs instead of an elevator.  Take a walk during your lunch break.  If you drive, park your car farther away from your work or school.  If you take public transportation, get off one stop early and walk the rest of the way.  Stand up or walk around during all of your indoor phone calls.  Get up, stretch, and walk around every 30 minutes throughout the day.  Enjoy exercise with a friend. Support to continue exercising will help you keep a regular routine of activity. What guidelines can I follow while exercising?  Before you start a new exercise program, talk with your health care provider.  Do not exercise so much that you hurt yourself, feel dizzy, or get very short of breath.  Wear comfortable clothes and wear shoes with good support.  Drink plenty of water while you exercise to prevent dehydration or heat stroke.  Work out until   your breathing and your heartbeat get faster. Where to find more information  [place] Department of Health and Human Services: www.hhs.gov  Centers for Disease Control and Prevention (CDC): www.cdc.gov Summary  Exercising regularly is important. It will improve your overall fitness,  flexibility, and endurance.  Regular exercise also will improve your overall health. It can help you control your weight, reduce stress, and improve your bone density.  Do not exercise so much that you hurt yourself, feel dizzy, or get very short of breath.  Before you start a new exercise program, talk with your health care provider. This information is not intended to replace advice given to you by your health care provider. Make sure you discuss any questions you have with your health care provider. Document Revised: 02/02/2017 Document Reviewed: 01/11/2017 Elsevier Patient Education  2021 Elsevier Inc.  

## 2020-05-08 LAB — CMP14+EGFR
ALT: 17 IU/L (ref 0–32)
AST: 16 IU/L (ref 0–40)
Albumin/Globulin Ratio: 1.6 (ref 1.2–2.2)
Albumin: 4.3 g/dL (ref 3.8–4.8)
Alkaline Phosphatase: 61 IU/L (ref 44–121)
BUN/Creatinine Ratio: 18 (ref 12–28)
BUN: 14 mg/dL (ref 8–27)
Bilirubin Total: 0.5 mg/dL (ref 0.0–1.2)
CO2: 23 mmol/L (ref 20–29)
Calcium: 9.1 mg/dL (ref 8.7–10.3)
Chloride: 102 mmol/L (ref 96–106)
Creatinine, Ser: 0.76 mg/dL (ref 0.57–1.00)
Globulin, Total: 2.7 g/dL (ref 1.5–4.5)
Glucose: 83 mg/dL (ref 65–99)
Potassium: 4.3 mmol/L (ref 3.5–5.2)
Sodium: 141 mmol/L (ref 134–144)
Total Protein: 7 g/dL (ref 6.0–8.5)
eGFR: 89 mL/min/{1.73_m2} (ref >59)

## 2020-05-08 LAB — CBC WITH DIFFERENTIAL/PLATELET
Basophils Absolute: 0.1 10*3/uL (ref 0.0–0.2)
Basos: 1 %
EOS (ABSOLUTE): 0.5 10*3/uL — ABNORMAL HIGH (ref 0.0–0.4)
Eos: 8 %
Hematocrit: 38.4 % (ref 34.0–46.6)
Hemoglobin: 12.9 g/dL (ref 11.1–15.9)
Immature Grans (Abs): 0 10*3/uL (ref 0.0–0.1)
Immature Granulocytes: 0 %
Lymphocytes Absolute: 2.7 10*3/uL (ref 0.7–3.1)
Lymphs: 44 %
MCH: 32 pg (ref 26.6–33.0)
MCHC: 33.6 g/dL (ref 31.5–35.7)
MCV: 95 fL (ref 79–97)
Monocytes Absolute: 0.6 10*3/uL (ref 0.1–0.9)
Monocytes: 11 %
Neutrophils Absolute: 2.1 10*3/uL (ref 1.4–7.0)
Neutrophils: 36 %
Platelets: 216 10*3/uL (ref 150–450)
RBC: 4.03 x10E6/uL (ref 3.77–5.28)
RDW: 11.8 % (ref 11.7–15.4)
WBC: 6 10*3/uL (ref 3.4–10.8)

## 2020-05-08 LAB — LIPID PANEL
Chol/HDL Ratio: 1.9 ratio (ref 0.0–4.4)
Cholesterol, Total: 204 mg/dL — ABNORMAL HIGH (ref 100–199)
HDL: 105 mg/dL (ref >39)
LDL Chol Calc (NIH): 90 mg/dL (ref 0–99)
Triglycerides: 46 mg/dL (ref 0–149)
VLDL Cholesterol Cal: 9 mg/dL (ref 5–40)

## 2020-05-08 LAB — THYROID PANEL WITH TSH
Free Thyroxine Index: 1.8 (ref 1.2–4.9)
T3 Uptake Ratio: 28 % (ref 24–39)
T4, Total: 6.3 ug/dL (ref 4.5–12.0)
TSH: 3.03 u[IU]/mL (ref 0.450–4.500)

## 2020-05-31 DIAGNOSIS — Z1231 Encounter for screening mammogram for malignant neoplasm of breast: Secondary | ICD-10-CM | POA: Diagnosis not present

## 2020-07-23 ENCOUNTER — Ambulatory Visit: Payer: 59 | Admitting: Family Medicine

## 2020-07-23 ENCOUNTER — Encounter: Payer: Self-pay | Admitting: Family Medicine

## 2020-07-23 VITALS — BP 121/70 | HR 74

## 2020-07-23 DIAGNOSIS — J069 Acute upper respiratory infection, unspecified: Secondary | ICD-10-CM | POA: Diagnosis not present

## 2020-07-23 DIAGNOSIS — J0110 Acute frontal sinusitis, unspecified: Secondary | ICD-10-CM | POA: Diagnosis not present

## 2020-07-23 LAB — VERITOR FLU A/B WAIVED
Influenza A: NEGATIVE
Influenza B: NEGATIVE

## 2020-07-23 LAB — CULTURE, GROUP A STREP

## 2020-07-23 LAB — RAPID STREP SCREEN (MED CTR MEBANE ONLY): Strep Gp A Ag, IA W/Reflex: NEGATIVE

## 2020-07-23 MED ORDER — CEFDINIR 300 MG PO CAPS: 300.0000 mg | ORAL_CAPSULE | Freq: Two times a day (BID) | ORAL | 0 refills | Status: DC

## 2020-07-23 NOTE — Progress Notes (Signed)
BP 121/70   Pulse 74   SpO2 96%    Subjective:   Patient ID: Elizabeth Nash, female    DOB: Mar 20, 1955, 65 y.o.   MRN: [id number]  HPI: Elizabeth Nash is a 65 y.o. female presenting on 07/23/2020 for Cough (Nasal drainage, blisters in throat)   HPI Patient comes in complaining of cough and nasal congestion and sore throat and drainage.  She says she has been having these increasingly over the past 3 to 4 days.  She denies any fevers or chills or sick contacts that she knows of.  She is taking allergy medicine this time a year anyways which does help with symptoms.  She denies any shortness of breath or wheezing.  She is mainly worried about the nasal drainage and sores in her throat but she thinks the sore throat was caused by a nasal spray but just wanted to have things checked out.  Relevant past medical, surgical, family and social history reviewed and updated as indicated. Interim medical history since our last visit reviewed. Allergies and medications reviewed and updated.  Review of Systems  Constitutional: Negative for chills and fever.  HENT: Positive for congestion, postnasal drip, rhinorrhea and sore throat. Negative for ear discharge, ear pain, sinus pressure and sneezing.   Eyes: Negative for pain, redness and visual disturbance.  Respiratory: Negative for chest tightness and shortness of breath.   Cardiovascular: Negative for chest pain and leg swelling.  Genitourinary: Negative for difficulty urinating and dysuria.  Musculoskeletal: Negative for back pain and gait problem.  Skin: Negative for rash.  Neurological: Negative for light-headedness and headaches.  Psychiatric/Behavioral: Negative for agitation and behavioral problems.  All other systems reviewed and are negative.   Per HPI unless specifically indicated above   Allergies as of 07/23/2020      Reactions   Codeine    Penicillins    Sulfa Antibiotics       Medication List       Accurate as of Jul 23, 2020  11:59 PM. If you have any questions, ask your nurse or doctor.        ALPRAZolam 0.25 MG tablet Commonly known as: XANAX Take 1 tablet (0.25 mg total) by mouth 2 (two) times daily as needed for anxiety.   BIOTIN PO Take by mouth.   cefdinir 300 MG capsule Commonly known as: OMNICEF Take 1 capsule (300 mg total) by mouth 2 (two) times daily. 1 po BID Started by: Fransisca Kaufmann Honor Fairbank, MD   cetirizine 10 MG tablet Commonly known as: ZYRTEC Take 10 mg by mouth daily.   CITRACAL PO Take by mouth.   linaclotide 145 MCG Caps capsule Commonly known as: Linzess TAKE 1 CAPSULE (145 MCG TOTAL) BY MOUTH DAILY BEFORE BREAKFAST.   nitroGLYCERIN 0.4 MG SL tablet Commonly known as: NITROSTAT Place 1 tablet (0.4 mg total) under the tongue every 5 (five) minutes as needed for chest pain.   STOOL SOFTENER PO Take by mouth. Reported on 03/05/2015   vitamin C 500 MG tablet Commonly known as: ASCORBIC ACID Take 500 mg by mouth daily.        Objective:   BP 121/70   Pulse 74   SpO2 96%   Wt Readings from Last 3 Encounters:  05/07/20 144 lb (65.3 kg)  03/17/20 140 lb 12.8 oz (63.9 kg)  03/12/20 141 lb (64 kg)    Physical Exam Vitals reviewed.  Constitutional:      General: She is not in acute distress.  Appearance: She is well-developed. She is not diaphoretic.  HENT:     Right Ear: Tympanic membrane, ear canal and external ear normal.     Left Ear: Tympanic membrane, ear canal and external ear normal.     Nose: Mucosal edema and rhinorrhea present.     Right Sinus: No maxillary sinus tenderness or frontal sinus tenderness.     Left Sinus: No maxillary sinus tenderness or frontal sinus tenderness.     Mouth/Throat:     Pharynx: Uvula midline. No oropharyngeal exudate or posterior oropharyngeal erythema.     Tonsils: No tonsillar abscesses.  Eyes:     Conjunctiva/sclera: Conjunctivae normal.  Cardiovascular:     Rate and Rhythm: Normal rate and regular rhythm.     Heart  sounds: Normal heart sounds. No murmur heard.   Pulmonary:     Effort: Pulmonary effort is normal. No respiratory distress.     Breath sounds: Normal breath sounds. No wheezing.  Musculoskeletal:        General: No tenderness. Normal range of motion.  Lymphadenopathy:     Cervical: No cervical adenopathy.  Skin:    General: Skin is warm and dry.     Findings: No rash.  Neurological:     Mental Status: She is alert and oriented to person, place, and time.     Coordination: Coordination normal.  Psychiatric:        Behavior: Behavior normal.       Assessment & Plan:   Problem List Items Addressed This Visit   None   Visit Diagnoses    Upper respiratory tract infection, unspecified type    -  Primary   Relevant Medications   cefdinir (OMNICEF) 300 MG capsule   Other Relevant Orders   Veritor Flu A/B Waived (Completed)   Novel Coronavirus, NAA ([facility]) (Completed)   Rapid Strep Screen ([facility] ONLY) (Completed)   Acute non-recurrent frontal sinusitis       Relevant Medications   cetirizine (ZYRTEC) 10 MG tablet   cefdinir (OMNICEF) 300 MG capsule    Will treat like sinusitis based on pressure and drainage that she is having we will switch allergy medicine because she has been on the other 1 chronically  Follow up plan: Return if symptoms worsen or fail to improve.  Counseling provided for all of the vaccine components Orders Placed This Encounter  Procedures  . Novel Coronavirus, NAA ([facility])  . Rapid Strep Screen ([facility] ONLY)  . Culture, Group A Strep  . SARS-COV-2, NAA 2 DAY TAT  . Veritor Flu A/B [place], MD [facility] 07/26/2020, 2:58 PM

## 2020-07-24 LAB — SARS-COV-2, NAA 2 DAY TAT

## 2020-07-24 LAB — NOVEL CORONAVIRUS, NAA: SARS-CoV-2, NAA: NOT DETECTED

## 2020-08-27 ENCOUNTER — Telehealth: Payer: Self-pay | Admitting: Nurse Practitioner

## 2020-10-21 ENCOUNTER — Ambulatory Visit: Payer: 59 | Admitting: Nurse Practitioner

## 2020-10-21 ENCOUNTER — Encounter: Payer: Self-pay | Admitting: Nurse Practitioner

## 2020-10-21 ENCOUNTER — Other Ambulatory Visit: Payer: Self-pay

## 2020-10-21 VITALS — BP 113/70 | HR 69 | Temp 97.7°F | Resp 20 | Ht 67.0 in | Wt 143.0 lb

## 2020-10-21 DIAGNOSIS — F411 Generalized anxiety disorder: Secondary | ICD-10-CM | POA: Diagnosis not present

## 2020-10-21 DIAGNOSIS — K640 First degree hemorrhoids: Secondary | ICD-10-CM

## 2020-10-21 DIAGNOSIS — K5904 Chronic idiopathic constipation: Secondary | ICD-10-CM | POA: Diagnosis not present

## 2020-10-21 MED ORDER — LINACLOTIDE 145 MCG PO CAPS: ORAL_CAPSULE | ORAL | 5 refills | Status: DC

## 2020-10-21 MED ORDER — ALPRAZOLAM 0.25 MG PO TABS: 0.2500 mg | ORAL_TABLET | Freq: Two times a day (BID) | ORAL | 5 refills | Status: DC | PRN

## 2020-10-21 NOTE — Patient Instructions (Signed)
Textbook of family medicine (9th ed., pp. 1062-1073). [place], [place]: [employer].">  Stress, Adult Stress is a normal reaction to life events. Stress is what you feel when life demands more than you are used to, or more than you think you can handle. Some stress can be useful, such as studying for a test or meeting a deadline at work. Stress that occurs too often or for too long can cause problems. It can affect your emotional health and interfere with relationships and normal daily activities. Too much stress can weaken your body's defense system (immune system) and increase your risk for physical illness. If you already have a medicalproblem, stress can make it worse. What are the causes? All sorts of life events can cause stress. An event that causes stress for one person may not be stressful for another person. Major life events, whether positive or negative, commonly cause stress. Examples include: Losing a job or starting a new job. Losing a loved one. Moving to a new town or home. Getting married or divorced. Having a baby. Getting injured or sick. Less obvious life events can also cause stress, especially if they occur day after day or in combination with each other. Examples include: Working long hours. Driving in traffic. Caring for children. Being in debt. Being in a difficult relationship. What are the signs or symptoms? Stress can cause emotional symptoms, including: Anxiety. This is feeling worried, afraid, on edge, overwhelmed, or out of control. Anger, including irritation or impatience. Depression. This is feeling sad, down, helpless, or guilty. Trouble focusing, remembering, or making decisions. Stress can cause physical symptoms, including: Aches and pains. These may affect your head, neck, back, stomach, or other areas of your body. Tight muscles or a clenched jaw. Low energy. Trouble sleeping. Stress can cause unhealthy behaviors, including: Eating to feel better  (overeating) or skipping meals. Working too much or putting off tasks. Smoking, drinking alcohol, or using drugs to feel better. How is this diagnosed? Stress is diagnosed through an assessment by your health care provider. He or she may diagnose this condition based on: Your symptoms and any stressful life events. Your medical history. Tests to rule out other causes of your symptoms. Depending on your condition, your health care provider may refer you to aspecialist for further evaluation. How is this treated?  Stress management techniques are the recommended treatment for stress. Medicineis not typically recommended for the treatment of stress. Techniques to reduce your reaction to stressful life events include: Stress identification. Monitor yourself for symptoms of stress and identify what causes stress for you. These skills may help you to avoid or prepare for stressful events. Time management. Set your priorities, keep a calendar of events, and learn to say no. Taking these actions can help you avoid making too many commitments. Techniques for coping with stress include: Rethinking the problem. Try to think realistically about stressful events rather than ignoring them or overreacting. Try to find the positives in a stressful situation rather than focusing on the negatives. Exercise. Physical exercise can release both physical and emotional tension. The key is to find a form of exercise that you enjoy and do it regularly. Relaxation techniques. These relax the body and mind. The key is to find one or more that you enjoy and use the techniques regularly. Examples include: Meditation, deep breathing, or progressive relaxation techniques. Yoga or tai chi. Biofeedback, mindfulness techniques, or journaling. Listening to music, being out in nature, or participating in other hobbies. Practicing a healthy lifestyle.   Eat a balanced diet, drink plenty of water, limit or avoid caffeine, and get  plenty of sleep. Having a strong support network. Spend time with family, friends, or other people you enjoy being around. Express your feelings and talk things over with someone you trust. Counseling or talk therapy with a mental health professional may be helpful if you are havingtrouble managing stress on your own. Follow these instructions at home: Lifestyle  Avoid drugs. Do not use any products that contain nicotine or tobacco, such as cigarettes, e-cigarettes, and chewing tobacco. If you need help quitting, ask your health care provider. Limit alcohol intake to no more than 1 drink a day for nonpregnant women and 2 drinks a day for men. One drink equals 12 oz of beer, 5 oz of wine, or 1 oz of hard liquor Do not use alcohol or drugs to relax. Eat a balanced diet that includes fresh fruits and vegetables, whole grains, lean meats, fish, eggs, and beans, and low-fat dairy. Avoid processed foods and foods high in added fat, sugar, and salt. Exercise at least 30 minutes on 5 or more days each week. Get 7-8 hours of sleep each night.  General instructions  Practice stress management techniques as discussed with your health care provider. Drink enough fluid to keep your urine clear or pale yellow. Take over-the-counter and prescription medicines only as told by your health care provider. Keep all follow-up visits as told by your health care provider. This is important.  Contact a health care provider if: Your symptoms get worse. You have new symptoms. You feel overwhelmed by your problems and can no longer manage them on your own. Get help right away if: You have thoughts of hurting yourself or others. If you ever feel like you may hurt yourself or others, or have thoughts about taking your own life, get help right away. You can go to your nearest emergency department or call: Your local emergency services (911 in the [place] A suicide crisis helpline, such as the [facility] at 4253100524. This is open 24 hours a day. Summary Stress is a normal reaction to life events. It can cause problems if it happens too often or for too long. Practicing stress management techniques is the best way to treat stress. Counseling or talk therapy with a mental health professional may be helpful if you are having trouble managing stress on your own. This information is not intended to replace advice given to you by your health care provider. Make sure you discuss any questions you have with your healthcare provider. Document Revised: 11/07/2019 Document Reviewed: 11/07/2019 Elsevier Patient Education  2022 [employer].

## 2020-10-21 NOTE — Progress Notes (Signed)
Subjective:    Patient ID: Elizabeth Nash, female    DOB: 1955-11-26, 65 y.o.   MRN: [id number]   Chief Complaint: Medical Management of Chronic Issues    HPI:  1. Chronic idiopathic constipation Says has gotten better. She says she goes almost everyday. Got better after she retired. Sh eis on linzess daily.  2. Grade I hemorrhoids They have not been bothering her.  3. GAD (generalized anxiety disorder) Is on xanax BID. Has been on these for years. GAD 7 : Generalized Anxiety Score 10/21/2020 04/28/2019  Nervous, Anxious, on Edge 0 0  Control/stop worrying 0 0  Worry too much - different things 0 0  Trouble relaxing 0 1  Restless 0 0  Easily annoyed or irritable 0 0  Afraid - awful might happen 0 0  Total GAD 7 Score 0 1  Anxiety Difficulty Not difficult at all Not difficult at all        Outpatient Encounter Medications as of 10/21/2020  Medication Sig   ALPRAZolam (XANAX) 0.25 MG tablet Take 1 tablet (0.25 mg total) by mouth 2 (two) times daily as needed for anxiety.   BIOTIN PO Take by mouth.   Calcium Citrate (CITRACAL PO) Take by mouth.   cetirizine (ZYRTEC) 10 MG tablet Take 10 mg by mouth daily.   Docusate Calcium (STOOL SOFTENER PO) Take by mouth. Reported on 03/05/2015   linaclotide (LINZESS) 145 MCG CAPS capsule TAKE 1 CAPSULE (145 MCG TOTAL) BY MOUTH DAILY BEFORE BREAKFAST.   nitroGLYCERIN (NITROSTAT) 0.4 MG SL tablet Place 1 tablet (0.4 mg total) under the tongue every 5 (five) minutes as needed for chest pain.   vitamin C (ASCORBIC ACID) 500 MG tablet Take 500 mg by mouth daily.   [DISCONTINUED] cefdinir (OMNICEF) 300 MG capsule Take 1 capsule (300 mg total) by mouth 2 (two) times daily. 1 po BID   No facility-administered encounter medications on file as of 10/21/2020.    Past Surgical History:  Procedure Laterality Date   BREAST SURGERY Bilateral 02-21-2012   L-300cc,R-325cc   CESAREAN SECTION     INCISION AND DRAINAGE Right 01/16/2013   Procedure:  INCISION AND DRAINAGE;  Surgeon: Tennis Must, MD;  Location: [facility];  Service: Orthopedics;  Laterality: Right;   TUBAL LIGATION      Family History  Problem Relation Age of Onset   Hypertension Mother    Hypothyroidism Mother    Cancer Father        ESOPHAGEAL AND BRAIN    New complaints: None otday  Social history: Her daughter and grandson live with her.  Controlled substance contract: 10/21/20     Review of Systems  Constitutional:  Negative for diaphoresis.  Eyes:  Negative for pain.  Respiratory:  Negative for shortness of breath.   Cardiovascular:  Negative for chest pain, palpitations and leg swelling.  Gastrointestinal:  Negative for abdominal pain.  Endocrine: Negative for polydipsia.  Skin:  Negative for rash.  Neurological:  Negative for dizziness, weakness and headaches.  Hematological:  Does not bruise/bleed easily.  All other systems reviewed and are negative.     Objective:   Physical Exam Vitals and nursing note reviewed.  Constitutional:      General: She is not in acute distress.    Appearance: Normal appearance. She is well-developed.  HENT:     Head: Normocephalic.     Right Ear: Tympanic membrane normal.     Left Ear: Tympanic membrane normal.  Nose: Nose normal.     Mouth/Throat:     Mouth: Mucous membranes are moist.  Eyes:     Pupils: Pupils are equal, round, and reactive to light.  Neck:     Vascular: No carotid bruit or JVD.  Cardiovascular:     Rate and Rhythm: Normal rate and regular rhythm.     Heart sounds: Normal heart sounds.  Pulmonary:     Effort: Pulmonary effort is normal. No respiratory distress.     Breath sounds: Normal breath sounds. No wheezing or rales.  Chest:     Chest wall: No tenderness.  Abdominal:     General: Bowel sounds are normal. There is no distension or abdominal bruit.     Palpations: Abdomen is soft. There is no hepatomegaly, splenomegaly, mass or pulsatile mass.      Tenderness: There is no abdominal tenderness.  Musculoskeletal:        General: Normal range of motion.     Cervical back: Normal range of motion and neck supple.  Lymphadenopathy:     Cervical: No cervical adenopathy.  Skin:    General: Skin is warm and dry.  Neurological:     Mental Status: She is alert and oriented to person, place, and time.     Deep Tendon Reflexes: Reflexes are normal and symmetric.  Psychiatric:        Behavior: Behavior normal.        Thought Content: Thought content normal.        Judgment: Judgment normal.   BP 113/70   Pulse 69   Temp 97.7 F (36.5 C) (Temporal)   Resp 20   Ht '5\' 7"'$  (1.702 m)   Wt 143 lb (64.9 kg)   SpO2 99%   BMI 22.40 kg/m         Assessment & Plan:   Lenard Simmer in today with chief complaint of Medical Management of Chronic Issues   1. Chronic idiopathic constipation Force fluids Daily fiber intake - linaclotide (LINZESS) 145 MCG CAPS capsule; TAKE 1 CAPSULE (145 MCG TOTAL) BY MOUTH DAILY BEFORE BREAKFAST.  Dispense: 30 capsule; Refill: 5  2. Grade I hemorrhoids Do not strain to have a bowel movement  3. GAD (generalized anxiety disorder) Stress management - ALPRAZolam (XANAX) 0.25 MG tablet; Take 1 tablet (0.25 mg total) by mouth 2 (two) times daily as needed for anxiety.  Dispense: 60 tablet; Refill: 5    The above assessment and management plan was discussed with the patient. The patient verbalized understanding of and has agreed to the management plan. Patient is aware to call the clinic if symptoms persist or worsen. Patient is aware when to return to the clinic for a follow-up visit. Patient educated on when it is appropriate to go to the emergency department.   Mary-Margaret Hassell Done, FNP

## 2021-04-20 ENCOUNTER — Other Ambulatory Visit: Payer: Self-pay | Admitting: Nurse Practitioner

## 2021-04-20 DIAGNOSIS — F411 Generalized anxiety disorder: Secondary | ICD-10-CM

## 2021-05-16 ENCOUNTER — Ambulatory Visit (INDEPENDENT_AMBULATORY_CARE_PROVIDER_SITE_OTHER): Payer: 59 | Admitting: Nurse Practitioner

## 2021-05-16 ENCOUNTER — Other Ambulatory Visit (HOSPITAL_COMMUNITY)
Admission: RE | Admit: 2021-05-16 | Discharge: 2021-05-16 | Disposition: A | Discharge status: Home / Self Care | Payer: Self-pay | Source: Ambulatory Visit | Attending: Nurse Practitioner | Admitting: Nurse Practitioner

## 2021-05-16 ENCOUNTER — Encounter: Payer: Self-pay | Admitting: Nurse Practitioner

## 2021-05-16 VITALS — BP 122/77 | HR 63 | Temp 97.7°F | Resp 20 | Ht 67.0 in | Wt 152.0 lb

## 2021-05-16 DIAGNOSIS — Z0001 Encounter for general adult medical examination with abnormal findings: Secondary | ICD-10-CM

## 2021-05-16 DIAGNOSIS — F411 Generalized anxiety disorder: Secondary | ICD-10-CM | POA: Diagnosis not present

## 2021-05-16 DIAGNOSIS — K5904 Chronic idiopathic constipation: Secondary | ICD-10-CM | POA: Diagnosis not present

## 2021-05-16 DIAGNOSIS — R42 Dizziness and giddiness: Secondary | ICD-10-CM

## 2021-05-16 DIAGNOSIS — Z0000 Encounter for general adult medical examination without abnormal findings: Secondary | ICD-10-CM | POA: Insufficient documentation

## 2021-05-16 LAB — URINALYSIS, COMPLETE
Bilirubin, UA: NEGATIVE
Glucose, UA: NEGATIVE
Ketones, UA: NEGATIVE
Leukocytes,UA: NEGATIVE
Nitrite, UA: NEGATIVE
Protein,UA: NEGATIVE
RBC, UA: NEGATIVE
Specific Gravity, UA: 1.02 (ref 1.005–1.030)
Urobilinogen, Ur: 0.2 mg/dL (ref 0.2–1.0)
pH, UA: 5.5 (ref 5.0–7.5)

## 2021-05-16 LAB — MICROSCOPIC EXAMINATION
Bacteria, UA: NONE SEEN
Epithelial Cells (non renal): NONE SEEN /hpf (ref 0–10)
RBC, Urine: NONE SEEN /hpf (ref 0–2)
Renal Epithel, UA: NONE SEEN /hpf
WBC, UA: NONE SEEN /hpf (ref 0–5)

## 2021-05-16 LAB — LIPID PANEL

## 2021-05-16 MED ORDER — PREDNISONE 20 MG PO TABS: 40.0000 mg | ORAL_TABLET | Freq: Every day | ORAL | 0 refills | Status: AC

## 2021-05-16 MED ORDER — ALPRAZOLAM 0.25 MG PO TABS: 0.2500 mg | ORAL_TABLET | Freq: Two times a day (BID) | ORAL | 5 refills | Status: DC | PRN

## 2021-05-16 NOTE — Progress Notes (Signed)
? ?Subjective:  ? ? Patient ID: Elizabeth Nash, female    DOB: 1955/10/13, 66 y.o.   MRN: [id number] ? ?Chief Complaint: annual physical  ? ?HPI: ? ?Elizabeth Nash is a 66 y.o. who identifies as a female who was assigned female at birth.  ? ?Social history: ?Lives with: by herself- her daughter, and her grandkids are with her currently ?Work history: retired from [employer], [employer] ? ? ?Comes in today for follow up of the following chronic medical issues: ? ?1. Annual physical exam ?past pap was in 2/ 2020 and was normal ? ?2. GAD (generalized anxiety disorder) ?Is on xanax and take BID ?GAD 7 : Generalized Anxiety Score 05/16/2021 10/21/2020 04/28/2019  ?Nervous, Anxious, on Edge 0 0 0  ?Control/stop worrying 0 0 0  ?Worry too much - different things 0 0 0  ?Trouble relaxing 0 0 1  ?Restless 0 0 0  ?Easily annoyed or irritable 0 0 0  ?Afraid - awful might happen 0 0 0  ?Total GAD 7 Score 0 0 1  ?Anxiety Difficulty Not difficult at all Not difficult at all Not difficult at all  ? ? ? ? ?3. Chronic idiopathic constipation ?Was  on linzess and that helped a lot. But since she quit working she  has bowel movement about 5x a week without medication. ? ? ?New complaints: ?Having dizzy spells when she lays down or stands up. Has been daily for over a month.only lasts a few seconds. ? ?Allergies  ?Allergen Reactions  ? Codeine   ? Penicillins   ? Sulfa Antibiotics   ? ?Outpatient Encounter Medications as of 05/16/2021  ?Medication Sig  ? ALPRAZolam (XANAX) 0.25 MG tablet Take 1 tablet (0.25 mg total) by mouth 2 (two) times daily as needed for anxiety.  ? BIOTIN PO Take by mouth.  ? Calcium Citrate (CITRACAL PO) Take by mouth.  ? cetirizine (ZYRTEC) 10 MG tablet Take 10 mg by mouth daily.  ? Docusate Calcium (STOOL SOFTENER PO) Take by mouth. Reported on 03/05/2015  ? linaclotide (LINZESS) 145 MCG CAPS capsule TAKE 1 CAPSULE (145 MCG TOTAL) BY MOUTH DAILY BEFORE BREAKFAST.  ? nitroGLYCERIN (NITROSTAT) 0.4 MG SL tablet Place 1 tablet (0.4 mg total)  under the tongue every 5 (five) minutes as needed for chest pain.  ? vitamin C (ASCORBIC ACID) 500 MG tablet Take 500 mg by mouth daily.  ? ?No facility-administered encounter medications on file as of 05/16/2021.  ? ? ?Past Surgical History:  ?Procedure Laterality Date  ? BREAST SURGERY Bilateral 02-21-2012  ? L-300cc,R-325cc  ? CESAREAN SECTION    ? INCISION AND DRAINAGE Right 01/16/2013  ? Procedure: INCISION AND DRAINAGE;  Surgeon: Tennis Must, MD;  Location: [facility];  Service: Orthopedics;  Laterality: Right;  ? TUBAL LIGATION    ? ? ?Family History  ?Problem Relation Age of Onset  ? Hypertension Mother   ? Hypothyroidism Mother   ? Cancer Father   ?     ESOPHAGEAL AND BRAIN  ? ? ? ? ?Controlled substance contract: n/a ? ? ? ? ?Review of Systems  ?Constitutional:  Negative for diaphoresis.  ?Eyes:  Negative for pain.  ?Respiratory:  Negative for shortness of breath.   ?Cardiovascular:  Negative for chest pain, palpitations and leg swelling.  ?Gastrointestinal:  Negative for abdominal pain.  ?Endocrine: Negative for polydipsia.  ?Skin:  Negative for rash.  ?Neurological:  Negative for dizziness, weakness and headaches.  ?Hematological:  Does not bruise/bleed easily.  ?All other  systems reviewed and are negative. ? ?   ?Objective:  ? Physical Exam ?Vitals and nursing note reviewed.  ?Constitutional:   ?   General: She is not in acute distress. ?   Appearance: Normal appearance. She is well-developed.  ?HENT:  ?   Head: Normocephalic.  ?   Right Ear: Tympanic membrane normal.  ?   Left Ear: Tympanic membrane normal.  ?   Nose: Nose normal.  ?   Mouth/Throat:  ?   Mouth: Mucous membranes are moist.  ?Eyes:  ?   Pupils: Pupils are equal, round, and reactive to light.  ?Neck:  ?   Vascular: No carotid bruit or JVD.  ?Cardiovascular:  ?   Rate and Rhythm: Normal rate and regular rhythm.  ?   Heart sounds: Normal heart sounds.  ?Pulmonary:  ?   Effort: Pulmonary effort is normal. No respiratory  distress.  ?   Breath sounds: Normal breath sounds. No wheezing or rales.  ?Chest:  ?   Chest wall: No tenderness.  ?Abdominal:  ?   General: Bowel sounds are normal. There is no distension or abdominal bruit.  ?   Palpations: Abdomen is soft. There is no hepatomegaly, splenomegaly, mass or pulsatile mass.  ?   Tenderness: There is no abdominal tenderness.  ?Genitourinary: ?   General: Normal vulva.  ?   Rectum: Normal.  ?   Comments: No adnexal masses or tenderness ?Cervix parous and pink ?Musculoskeletal:     ?   General: Normal range of motion.  ?   Cervical back: Normal range of motion and neck supple.  ?Lymphadenopathy:  ?   Cervical: No cervical adenopathy.  ?Skin: ?   General: Skin is warm and dry.  ?Neurological:  ?   Mental Status: She is alert and oriented to person, place, and time.  ?   Deep Tendon Reflexes: Reflexes are normal and symmetric.  ?Psychiatric:     ?   Behavior: Behavior normal.     ?   Thought Content: Thought content normal.     ?   Judgment: Judgment normal.  ? ?BP 122/77   Pulse 63   Temp 97.7 ?F (36.5 ?C) (Temporal)   Resp 20   Ht $R'5\' 7"'IU$  (1.702 m)   Wt 152 lb (68.9 kg)   SpO2 100%   BMI 23.81 kg/m?  ? ? ? ? ?   ?Assessment & Plan:  ? ?Elizabeth Nash comes in today with chief complaint of Annual Exam ? ? ?Diagnosis and orders addressed: ? ?1. Annual physical exam ?labs pending ?- Urinalysis, Complete ?- CBC with Differential/Platelet ?- CMP14+EGFR ?- Lipid panel ?- Thyroid Panel With TSH ?- Cytology - PAP ? ?2. GAD (generalized anxiety disorder) ?Stress management ?- ALPRAZolam (XANAX) 0.25 MG tablet; Take 1 tablet (0.25 mg total) by mouth 2 (two) times daily as needed for anxiety.  Dispense: 60 tablet; Refill: 5 ? ?3. Chronic idiopathic constipation ?Let me know if needs linzess again ? ?4. Vertigo ?Force fluids ?- predniSONE (DELTASONE) 20 MG tablet; Take 2 tablets (40 mg total) by mouth daily with breakfast for 5 days. 2 po daily for 5 days  Dispense: 10 tablet; Refill: 0 ? ? ?Labs  pending ?Health Maintenance reviewed ?Diet and exercise encouraged ? ?Follow up plan: ?6 months ? ? ?Mary-Margaret Hassell Done, FNP ? ? ?

## 2021-05-16 NOTE — Patient Instructions (Signed)
Stress, Adult °Stress is a normal reaction to life events. Stress is what you feel when life demands more than you are used to, or more than you think you can handle. °Some stress can be useful, such as studying for a test or meeting a deadline at work. Stress that occurs too often or for too long can cause problems. Long-lasting stress is called chronic stress. Chronic stress can affect your emotional health and interfere with relationships and normal daily activities. °Too much stress can weaken your body's defense system (immune system) and increase your risk for physical illness. If you already have a medical problem, stress can make it worse. °What are the causes? °All sorts of life events can cause stress. An event that causes stress for one person may not be stressful for someone else. Major life events, whether positive or negative, commonly cause stress. Examples include: °Losing a job or starting a new job. °Losing a loved one. °Moving to a new town or home. °Getting married or divorced. °Having a baby. °Getting injured or sick. °Less obvious life events can also cause stress, especially if they occur day after day or in combination with each other. Examples include: °Working long hours. °Driving in traffic. °Caring for children. °Being in debt. °Being in a difficult relationship. °What are the signs or symptoms? °Stress can cause emotional and physical symptoms and can lead to unhealthy behaviors. These include the following: °Emotional symptoms °Anxiety. This is feeling worried, afraid, on edge, overwhelmed, or out of control. °Anger, including irritation or impatience. °Depression. This is feeling sad, down, helpless, or guilty. °Trouble focusing, remembering, or making decisions. °Physical symptoms °Aches and pains. These may affect your head, neck, back, stomach, or other areas of your body. °Tight muscles or a clenched jaw. °Low energy. °Trouble sleeping. °Unhealthy behaviors °Eating to feel better  (overeating) or skipping meals. °Working too much or putting off tasks. °Smoking, drinking alcohol, or using drugs to feel better. °How is this diagnosed? °A stress disorder is diagnosed through an assessment by your health care provider. A stress disorder may be diagnosed based on: °Your symptoms and any stressful life events. °Your medical history. °Tests to rule out other causes of your symptoms. °Depending on your condition, your health care provider may refer you to a specialist for further evaluation. °How is this treated? °Stress management techniques are the recommended treatment for stress. Medicine is not typically recommended for treating stress. °Techniques to reduce your reaction to stressful life events include: °Identifying stress. Monitor yourself for symptoms of stress and notice what causes stress for you. These skills may help you to avoid or prepare for stressful events. °Managing time. Set your priorities, keep a calendar of events, and learn to say no. These actions can help you avoid taking on too much. °Techniques for dealing with stress include: °Rethinking the problem. Try to think realistically about stressful events rather than ignoring them or overreacting. Try to find the positives in a stressful situation rather than focusing on the negatives. °Exercise. Physical exercise can release both physical and emotional tension. The key is to find a form of exercise that you enjoy and do it regularly. °Relaxation techniques. These relax the body and mind. Find one or more that you enjoy and use the techniques regularly. Examples include: °Meditation, deep breathing, or progressive relaxation techniques. °Yoga or tai chi. °Biofeedback, mindfulness techniques, or journaling. °Listening to music, being in nature, or taking part in other hobbies. °Practicing a healthy lifestyle. Eat a balanced diet,   drink plenty of water, limit or avoid caffeine, and get plenty of sleep. °Having a strong support  network. Spend time with family, friends, or other people you enjoy being around. Express your feelings and talk things over with someone you trust. °Counseling or talk therapy with a mental health provider may help if you are having trouble managing stress by yourself. °Follow these instructions at home: °Lifestyle ° °Avoid drugs. °Do not use any products that contain nicotine or tobacco. These products include cigarettes, chewing tobacco, and vaping devices, such as e-cigarettes. If you need help quitting, ask your health care provider. °If you drink alcohol: °Limit how much you have to: °0-1 drink a day for women who are not pregnant. °0-2 drinks a day for men. °Know how much alcohol is in a drink. In the [place] one drink equals one 12 oz bottle of beer (355 mL), one 5 oz glass of wine (148 mL), or one 1½ oz glass of hard liquor (44 mL). °Do not use alcohol or drugs to relax. °Eat a balanced diet that includes fresh fruits and vegetables, whole grains, lean meats, fish, eggs, beans, and low-fat dairy. Avoid processed foods and foods high in added fat, sugar, and salt. °Exercise at least 30 minutes on 5 or more days each week. °Get 7-8 hours of sleep each night. °General instructions ° °Practice stress management techniques as told by your health care provider. °Drink enough fluid to keep your urine pale yellow. °Take over-the-counter and prescription medicines only as told by your health care provider. °Keep all follow-up visits. This is important. °Contact a health care provider if: °Your symptoms get worse. °You have new symptoms. °You feel overwhelmed by your problems and can no longer manage them by yourself. °Get help right away if: °You have thoughts of hurting yourself or others. °Get help right awayif you feel like you may hurt yourself or others, or have thoughts about taking your own life. Go to your nearest emergency room or: °Call 911. °Call the National Suicide Prevention Lifeline at 1-800-273-8255 or  988. This is open 24 hours a day. °Text the Crisis Text Line at 741741. °Summary °Stress is a normal reaction to life events. It can cause problems if it happens too often or for too long. °Practicing stress management techniques is the best way to treat stress. °Counseling or talk therapy with a mental health provider may help if you are having trouble managing stress by yourself. °This information is not intended to replace advice given to you by your health care provider. Make sure you discuss any questions you have with your health care provider. °Document Revised: 09/30/2020 Document Reviewed: 09/30/2020 °Elsevier Patient Education © 2022 [employer] Inc. ° °

## 2021-05-17 LAB — CMP14+EGFR
ALT: 23 IU/L (ref 0–32)
AST: 17 IU/L (ref 0–40)
Albumin/Globulin Ratio: 1.5 (ref 1.2–2.2)
Albumin: 4.4 g/dL (ref 3.8–4.8)
Alkaline Phosphatase: 74 IU/L (ref 44–121)
BUN/Creatinine Ratio: 30 — ABNORMAL HIGH (ref 12–28)
BUN: 23 mg/dL (ref 8–27)
Bilirubin Total: 0.3 mg/dL (ref 0.0–1.2)
CO2: 24 mmol/L (ref 20–29)
Calcium: 9.4 mg/dL (ref 8.7–10.3)
Chloride: 102 mmol/L (ref 96–106)
Creatinine, Ser: 0.76 mg/dL (ref 0.57–1.00)
Globulin, Total: 2.9 g/dL (ref 1.5–4.5)
Glucose: 96 mg/dL (ref 70–99)
Potassium: 4.9 mmol/L (ref 3.5–5.2)
Sodium: 139 mmol/L (ref 134–144)
Total Protein: 7.3 g/dL (ref 6.0–8.5)
eGFR: 88 mL/min/{1.73_m2} (ref >59)

## 2021-05-17 LAB — CBC WITH DIFFERENTIAL/PLATELET
Basophils Absolute: 0.1 10*3/uL (ref 0.0–0.2)
Basos: 1 %
EOS (ABSOLUTE): 0.3 10*3/uL (ref 0.0–0.4)
Eos: 5 %
Hematocrit: 40.9 % (ref 34.0–46.6)
Hemoglobin: 14 g/dL (ref 11.1–15.9)
Immature Grans (Abs): 0 10*3/uL (ref 0.0–0.1)
Immature Granulocytes: 0 %
Lymphocytes Absolute: 2.2 10*3/uL (ref 0.7–3.1)
Lymphs: 37 %
MCH: 31.6 pg (ref 26.6–33.0)
MCHC: 34.2 g/dL (ref 31.5–35.7)
MCV: 92 fL (ref 79–97)
Monocytes Absolute: 0.5 10*3/uL (ref 0.1–0.9)
Monocytes: 9 %
Neutrophils Absolute: 2.8 10*3/uL (ref 1.4–7.0)
Neutrophils: 48 %
Platelets: 218 10*3/uL (ref 150–450)
RBC: 4.43 x10E6/uL (ref 3.77–5.28)
RDW: 11.9 % (ref 11.7–15.4)
WBC: 5.8 10*3/uL (ref 3.4–10.8)

## 2021-05-17 LAB — THYROID PANEL WITH TSH
Free Thyroxine Index: 1.3 (ref 1.2–4.9)
T3 Uptake Ratio: 23 % — ABNORMAL LOW (ref 24–39)
T4, Total: 5.7 ug/dL (ref 4.5–12.0)
TSH: 3.44 u[IU]/mL (ref 0.450–4.500)

## 2021-05-17 LAB — LIPID PANEL
Chol/HDL Ratio: 2.5 ratio (ref 0.0–4.4)
Cholesterol, Total: 209 mg/dL — ABNORMAL HIGH (ref 100–199)
HDL: 84 mg/dL (ref >39)
LDL Chol Calc (NIH): 113 mg/dL — ABNORMAL HIGH (ref 0–99)
Triglycerides: 67 mg/dL (ref 0–149)
VLDL Cholesterol Cal: 12 mg/dL (ref 5–40)

## 2021-05-17 LAB — CYTOLOGY - PAP: Diagnosis: NEGATIVE

## 2021-08-04 ENCOUNTER — Other Ambulatory Visit: Payer: Self-pay | Admitting: Nurse Practitioner

## 2021-08-04 ENCOUNTER — Telehealth: Payer: Self-pay | Admitting: Nurse Practitioner

## 2021-08-04 MED ORDER — ALPRAZOLAM 0.5 MG PO TABS: 0.2500 mg | ORAL_TABLET | Freq: Two times a day (BID) | ORAL | 0 refills | Status: DC | PRN

## 2021-08-04 NOTE — Telephone Encounter (Signed)
Meds ordered this encounter  Medications   ALPRAZolam (XANAX) 0.5 MG tablet    Sig: Take 0.5 tablets (0.25 mg total) by mouth 2 (two) times daily as needed for anxiety.    Dispense:  30 tablet    Refill:  0    Order Specific Question:   Supervising Provider    Answer:   Caryl Pina A [id number]

## 2021-10-13 ENCOUNTER — Encounter: Payer: Self-pay | Admitting: Family Medicine

## 2021-10-13 ENCOUNTER — Ambulatory Visit (INDEPENDENT_AMBULATORY_CARE_PROVIDER_SITE_OTHER): Payer: 59

## 2021-10-13 ENCOUNTER — Ambulatory Visit (INDEPENDENT_AMBULATORY_CARE_PROVIDER_SITE_OTHER): Payer: 59 | Admitting: Family Medicine

## 2021-10-13 VITALS — BP 150/83 | HR 68 | Temp 97.3°F | Ht 67.0 in | Wt 152.0 lb

## 2021-10-13 DIAGNOSIS — M546 Pain in thoracic spine: Secondary | ICD-10-CM | POA: Diagnosis not present

## 2021-10-13 DIAGNOSIS — R079 Chest pain, unspecified: Secondary | ICD-10-CM | POA: Diagnosis not present

## 2021-10-13 DIAGNOSIS — M5489 Other dorsalgia: Secondary | ICD-10-CM | POA: Diagnosis not present

## 2021-10-13 LAB — URINALYSIS, ROUTINE W REFLEX MICROSCOPIC
Bilirubin, UA: NEGATIVE
Glucose, UA: NEGATIVE
Ketones, UA: NEGATIVE
Nitrite, UA: NEGATIVE
Protein,UA: NEGATIVE
RBC, UA: NEGATIVE
Specific Gravity, UA: 1.015 (ref 1.005–1.030)
Urobilinogen, Ur: 0.2 mg/dL (ref 0.2–1.0)
pH, UA: 7 (ref 5.0–7.5)

## 2021-10-13 LAB — MICROSCOPIC EXAMINATION
Bacteria, UA: NONE SEEN
RBC, Urine: NONE SEEN /hpf (ref 0–2)
Renal Epithel, UA: NONE SEEN /hpf

## 2021-10-13 NOTE — Progress Notes (Signed)
Assessment & Plan:  1. Acute right-sided thoracic back pain Chest x-ray to view the lungs. Does not appear to be muscular or bone causing pain. - Urinalysis, Routine w reflex microscopic - Urine dipstick shows positive for leukocytes.  - DG Chest 2 View    Follow up plan: Return if symptoms worsen or fail to improve.  Hendricks Limes, MSN, APRN, FNP-C [facility]  Subjective:   Patient ID: Elizabeth Nash, female    DOB: 25-Oct-1955, 66 y.o.   MRN: [id number]  HPI: Elizabeth Nash is a 66 y.o. female presenting on 10/13/2021 for Back Pain (Dull burning mid back pain x 2 weeks )  Patient reports a dull burning pain on the right side of her middle to upper back that has been present for two weeks.  The pain is constant and she feels it is more on the inside.  She rates the pain 6-7/10 when she is active but only 3/10 at rest.  She denies any unusual activity or recent falls.  Pain is not worsened with deep breathing or palpation.  She does have an 42-monthold [place] she sometimes carries around, but feels this is completely unrelated.  She has taken ibuprofen which is not at all helpful.  She has not had any respiratory illnesses recently.  She does not smoke.   ROS: Negative unless specifically indicated above in HPI.   Relevant past medical history reviewed and updated as indicated.   Allergies and medications reviewed and updated.   Current Outpatient Medications:    ALPRAZolam (XANAX) 0.25 MG tablet, Take 1 tablet (0.25 mg total) by mouth 2 (two) times daily as needed for anxiety., Disp: 60 tablet, Rfl: 5   ALPRAZolam (XANAX) 0.5 MG tablet, Take 0.5 tablets (0.25 mg total) by mouth 2 (two) times daily as needed for anxiety., Disp: 30 tablet, Rfl: 0   BIOTIN PO, Take by mouth., Disp: , Rfl:    Calcium Citrate (CITRACAL PO), Take by mouth., Disp: , Rfl:    cetirizine (ZYRTEC) 10 MG tablet, Take 10 mg by mouth daily., Disp: , Rfl:    Docusate Calcium (STOOL  SOFTENER PO), Take by mouth. Reported on 03/05/2015, Disp: , Rfl:    nitroGLYCERIN (NITROSTAT) 0.4 MG SL tablet, Place 1 tablet (0.4 mg total) under the tongue every 5 (five) minutes as needed for chest pain., Disp: 50 tablet, Rfl: 3   vitamin C (ASCORBIC ACID) 500 MG tablet, Take 500 mg by mouth daily., Disp: , Rfl:    linaclotide (LINZESS) 145 MCG CAPS capsule, TAKE 1 CAPSULE (145 MCG TOTAL) BY MOUTH DAILY BEFORE BREAKFAST. (Patient not taking: Reported on 10/13/2021), Disp: 30 capsule, Rfl: 5  Allergies  Allergen Reactions   Codeine    Penicillins    Sulfa Antibiotics     Objective:   BP (!) 150/83   Pulse 68   Temp (!) 97.3 F (36.3 C) (Temporal)   Ht '5\' 7"'$  (1.702 m)   Wt 152 lb (68.9 kg)   SpO2 100%   BMI 23.81 kg/m    Physical Exam Vitals reviewed.  Constitutional:      General: She is not in acute distress.    Appearance: Normal appearance. She is not ill-appearing, toxic-appearing or diaphoretic.  HENT:     Head: Normocephalic and atraumatic.  Eyes:     General: No scleral icterus.       Right eye: No discharge.        Left eye: No discharge.  Conjunctiva/sclera: Conjunctivae normal.  Cardiovascular:     Rate and Rhythm: Normal rate.  Pulmonary:     Effort: Pulmonary effort is normal. No respiratory distress.     Breath sounds: Normal breath sounds. No stridor. No wheezing, rhonchi or rales.  Musculoskeletal:        General: Normal range of motion.     Cervical back: Normal range of motion.       Back:  Skin:    General: Skin is warm and dry.     Capillary Refill: Capillary refill takes less than 2 seconds.  Neurological:     General: No focal deficit present.     Mental Status: She is alert and oriented to person, place, and time. Mental status is at baseline.  Psychiatric:        Mood and Affect: Mood normal.        Behavior: Behavior normal.        Thought Content: Thought content normal.        Judgment: Judgment normal.

## 2021-10-14 ENCOUNTER — Telehealth: Payer: Self-pay | Admitting: Family Medicine

## 2021-10-14 DIAGNOSIS — M546 Pain in thoracic spine: Secondary | ICD-10-CM

## 2021-10-14 MED ORDER — METHOCARBAMOL 500 MG PO TABS: 500.0000 mg | ORAL_TABLET | Freq: Three times a day (TID) | ORAL | 0 refills | Status: DC | PRN

## 2021-10-14 NOTE — Telephone Encounter (Signed)
Robaxin sent to [facility].

## 2021-10-14 NOTE — Telephone Encounter (Signed)
Patient saw message on MyChart about her results from x ray. She would like to try a muscle relaxer and wants it sent to [facility]. Please call back when prescription is sent.

## 2021-11-17 ENCOUNTER — Encounter: Payer: Self-pay | Admitting: Nurse Practitioner

## 2021-11-17 ENCOUNTER — Ambulatory Visit (INDEPENDENT_AMBULATORY_CARE_PROVIDER_SITE_OTHER): Payer: 59 | Admitting: Nurse Practitioner

## 2021-11-17 VITALS — BP 136/75 | HR 55 | Temp 97.4°F | Resp 20 | Ht 67.0 in | Wt 151.0 lb

## 2021-11-17 DIAGNOSIS — K641 Second degree hemorrhoids: Secondary | ICD-10-CM | POA: Diagnosis not present

## 2021-11-17 DIAGNOSIS — F411 Generalized anxiety disorder: Secondary | ICD-10-CM

## 2021-11-17 DIAGNOSIS — Z23 Encounter for immunization: Secondary | ICD-10-CM | POA: Diagnosis not present

## 2021-11-17 DIAGNOSIS — K5904 Chronic idiopathic constipation: Secondary | ICD-10-CM | POA: Diagnosis not present

## 2021-11-17 DIAGNOSIS — R69 Illness, unspecified: Secondary | ICD-10-CM | POA: Diagnosis not present

## 2021-11-17 MED ORDER — ALPRAZOLAM 0.5 MG PO TABS: 0.2500 mg | ORAL_TABLET | Freq: Two times a day (BID) | ORAL | 5 refills | Status: DC | PRN

## 2021-11-17 MED ORDER — HYDROCORTISONE ACETATE 25 MG RE SUPP: 25.0000 mg | Freq: Two times a day (BID) | RECTAL | 0 refills | Status: DC

## 2021-11-17 NOTE — Progress Notes (Signed)
Subjective:    Patient ID: Elizabeth Nash, female    DOB: 1955/04/30, 66 y.o.   MRN: [id number]   Chief Complaint: Medical Management of Chronic Issues (Wants something for hemorrhoids)    HPI:  Elizabeth Nash is a 66 y.o. who identifies as a female who was assigned female at birth.   Social history: Lives with: by herself- her daughter check son her daily Work history: retired from [employer] in today for follow up of the following chronic medical issues:  1. Chronic idiopathic constipation Is on linzess and is doing well.  2. GAD (generalized anxiety disorder) Is on xanax - takes bid    11/17/2021   10:42 AM 05/16/2021    9:44 AM 10/21/2020   11:06 AM 04/28/2019    9:54 AM  GAD 7 : Generalized Anxiety Score  Nervous, Anxious, on Edge 0 0 0 0  Control/stop worrying 0 0 0 0  Worry too much - different things 0 0 0 0  Trouble relaxing 0 0 0 1  Restless 0 0 0 0  Easily annoyed or irritable 0 0 0 0  Afraid - awful might happen 0 0 0 0  Total GAD 7 Score 0 0 0 1  Anxiety Difficulty Not difficult at all Not difficult at all Not difficult at all Not difficult at all       New complaints: Really bad hemorrhoids- needs some suppositories.  Allergies  Allergen Reactions   Codeine    Penicillins    Sulfa Antibiotics    Outpatient Encounter Medications as of 11/17/2021  Medication Sig   ALPRAZolam (XANAX) 0.5 MG tablet Take 0.5 tablets (0.25 mg total) by mouth 2 (two) times daily as needed for anxiety.   BIOTIN PO Take by mouth.   Calcium Citrate (CITRACAL PO) Take by mouth.   cetirizine (ZYRTEC) 10 MG tablet Take 10 mg by mouth daily.   Docusate Calcium (STOOL SOFTENER PO) Take by mouth. Reported on 03/05/2015   nitroGLYCERIN (NITROSTAT) 0.4 MG SL tablet Place 1 tablet (0.4 mg total) under the tongue every 5 (five) minutes as needed for chest pain.   vitamin C (ASCORBIC ACID) 500 MG tablet Take 500 mg by mouth daily.   [DISCONTINUED] ALPRAZolam (XANAX) 0.25 MG tablet  Take 1 tablet (0.25 mg total) by mouth 2 (two) times daily as needed for anxiety.   [DISCONTINUED] linaclotide (LINZESS) 145 MCG CAPS capsule TAKE 1 CAPSULE (145 MCG TOTAL) BY MOUTH DAILY BEFORE BREAKFAST. (Patient not taking: Reported on 10/13/2021)   [DISCONTINUED] methocarbamol (ROBAXIN) 500 MG tablet Take 1 tablet (500 mg total) by mouth every 8 (eight) hours as needed for muscle spasms.   No facility-administered encounter medications on file as of 11/17/2021.    Past Surgical History:  Procedure Laterality Date   BREAST SURGERY Bilateral 02-21-2012   L-300cc,R-325cc   CESAREAN SECTION     INCISION AND DRAINAGE Right 01/16/2013   Procedure: INCISION AND DRAINAGE;  Surgeon: Tami Ribas, MD;  Location: [facility];  Service: Orthopedics;  Laterality: Right;   TUBAL LIGATION      Family History  Problem Relation Age of Onset   Hypertension Mother    Hypothyroidism Mother    Cancer Father        ESOPHAGEAL AND BRAIN      Controlled substance contract: 11/17/21     Review of Systems  Constitutional:  Negative for diaphoresis.  Eyes:  Negative for pain.  Respiratory:  Negative for shortness of  breath.   Cardiovascular:  Negative for chest pain, palpitations and leg swelling.  Gastrointestinal:  Negative for abdominal pain.  Endocrine: Negative for polydipsia.  Skin:  Negative for rash.  Neurological:  Negative for dizziness, weakness and headaches.  Hematological:  Does not bruise/bleed easily.  All other systems reviewed and are negative.      Objective:   Physical Exam Vitals and nursing note reviewed.  Constitutional:      General: She is not in acute distress.    Appearance: Normal appearance. She is well-developed.  HENT:     Head: Normocephalic.     Right Ear: Tympanic membrane normal.     Left Ear: Tympanic membrane normal.     Nose: Nose normal.     Mouth/Throat:     Mouth: Mucous membranes are moist.  Eyes:     Pupils: Pupils are equal,  round, and reactive to light.  Neck:     Vascular: No carotid bruit or JVD.  Cardiovascular:     Rate and Rhythm: Normal rate and regular rhythm.     Heart sounds: Normal heart sounds.  Pulmonary:     Effort: Pulmonary effort is normal. No respiratory distress.     Breath sounds: Normal breath sounds. No wheezing or rales.  Chest:     Chest wall: No tenderness.  Abdominal:     General: Bowel sounds are normal. There is no distension or abdominal bruit.     Palpations: Abdomen is soft. There is no hepatomegaly, splenomegaly, mass or pulsatile mass.     Tenderness: There is no abdominal tenderness.  Musculoskeletal:        General: Normal range of motion.     Cervical back: Normal range of motion and neck supple.  Lymphadenopathy:     Cervical: No cervical adenopathy.  Skin:    General: Skin is warm and dry.  Neurological:     Mental Status: She is alert and oriented to person, place, and time.     Deep Tendon Reflexes: Reflexes are normal and symmetric.  Psychiatric:        Behavior: Behavior normal.        Thought Content: Thought content normal.        Judgment: Judgment normal.    BP 136/75   Pulse (!) 55   Temp (!) 97.4 F (36.3 C) (Temporal)   Resp 20   Ht $R'5\' 7"'XD$  (1.702 m)   Wt 151 lb (68.5 kg)   SpO2 99%   BMI 23.65 kg/m         Assessment & Plan:  Elizabeth Nash comes in today with chief complaint of Medical Management of Chronic Issues (Wants something for hemorrhoids)   Diagnosis and orders addressed:  1. Chronic idiopathic constipation Increase fiber in diet - CBC with Differential/Platelet - CMP14+EGFR - Lipid panel  2. GAD (generalized anxiety disorder) Stress management - ToxASSURE Select 13 (MW), Urine - ALPRAZolam (XANAX) 0.5 MG tablet; Take 0.5 tablets (0.25 mg total) by mouth 2 (two) times daily as needed for anxiety.  Dispense: 60 tablet; Refill: 5  3. Grade II hemorrhoids Stool softner - hydrocortisone (ANUSOL-HC) 25 MG suppository; Place  1 suppository (25 mg total) rectally 2 (two) times daily.  Dispense: 12 suppository; Refill: 0   Labs pending Health Maintenance reviewed Diet and exercise encouraged  Follow up plan: 33months   Mary-Margaret Hassell Done, FNP

## 2021-11-17 NOTE — Patient Instructions (Signed)

## 2021-11-22 LAB — TOXASSURE SELECT 13 (MW), URINE

## 2021-12-19 ENCOUNTER — Other Ambulatory Visit (HOSPITAL_COMMUNITY): Payer: Self-pay | Admitting: Nurse Practitioner

## 2021-12-19 DIAGNOSIS — Z1231 Encounter for screening mammogram for malignant neoplasm of breast: Secondary | ICD-10-CM

## 2021-12-27 ENCOUNTER — Ambulatory Visit: Payer: Self-pay | Admitting: Physician Assistant

## 2021-12-28 ENCOUNTER — Ambulatory Visit (HOSPITAL_COMMUNITY): Payer: Self-pay

## 2022-01-02 ENCOUNTER — Ambulatory Visit (HOSPITAL_COMMUNITY)
Admission: RE | Admit: 2022-01-02 | Discharge: 2022-01-02 | Disposition: A | Discharge status: Home / Self Care | Payer: 59 | Source: Ambulatory Visit | Attending: Nurse Practitioner | Admitting: Nurse Practitioner

## 2022-01-02 DIAGNOSIS — Z1231 Encounter for screening mammogram for malignant neoplasm of breast: Secondary | ICD-10-CM | POA: Insufficient documentation

## 2022-03-03 ENCOUNTER — Encounter: Payer: Self-pay | Admitting: Family Medicine

## 2022-03-03 ENCOUNTER — Ambulatory Visit (INDEPENDENT_AMBULATORY_CARE_PROVIDER_SITE_OTHER): Payer: 59 | Admitting: Family Medicine

## 2022-03-03 VITALS — BP 138/69 | HR 65 | Temp 98.3°F | Ht 67.0 in | Wt 150.0 lb

## 2022-03-03 DIAGNOSIS — J069 Acute upper respiratory infection, unspecified: Secondary | ICD-10-CM | POA: Diagnosis not present

## 2022-03-03 DIAGNOSIS — H1033 Unspecified acute conjunctivitis, bilateral: Secondary | ICD-10-CM

## 2022-03-03 MED ORDER — OFLOXACIN 0.3 % OP SOLN: 2.0000 [drp] | Freq: Four times a day (QID) | OPHTHALMIC | 0 refills | Status: AC

## 2022-03-03 NOTE — Progress Notes (Signed)
Acute Office Visit  Subjective:     Patient ID: Elizabeth Nash, female    DOB: 1955/09/22, 66 y.o.   MRN: [id number]  Chief Complaint  Patient presents with   Conjunctivitis   chest congestion    And mild cough    Conjunctivitis  Episode onset: 9 days ago. The problem has been unchanged. Associated symptoms include photophobia (mild), congestion, ear pain (left ear), URI, eye discharge and eye redness. Pertinent negatives include no fever, no decreased vision, no double vision, no ear discharge, no rhinorrhea, no sore throat and no eye pain.   Family has had pink eye. She has been using her grandson's poltrim eye drops for 1 week without improve. Her cough and congestion started 4 days ago. She has been exposed to the flu.   Review of Systems  Constitutional:  Negative for fever.  HENT:  Positive for congestion and ear pain (left ear). Negative for ear discharge, rhinorrhea and sore throat.   Eyes:  Positive for photophobia (mild), discharge and redness. Negative for double vision and pain.        Objective:    BP 138/69   Pulse 65   Temp 98.3 F (36.8 C)   Ht '5\' 7"'$  (1.702 m)   Wt 150 lb (68 kg)   SpO2 99%   BMI 23.49 kg/m    Physical Exam Vitals and nursing note reviewed.  Constitutional:      General: She is not in acute distress.    Appearance: She is not ill-appearing, toxic-appearing or diaphoretic.  HENT:     Head: Normocephalic and atraumatic.     Right Ear: Tympanic membrane, ear canal and external ear normal.     Left Ear: Tympanic membrane, ear canal and external ear normal.     Nose: Congestion present.     Right Sinus: No maxillary sinus tenderness or frontal sinus tenderness.     Left Sinus: No maxillary sinus tenderness or frontal sinus tenderness.     Mouth/Throat:     Mouth: Mucous membranes are moist.     Pharynx: Oropharynx is clear. No pharyngeal swelling, posterior oropharyngeal erythema or uvula swelling.     Tonsils: No tonsillar exudate or  tonsillar abscesses.  Eyes:     General: Lids are normal.        Right eye: No discharge or hordeolum.        Left eye: No discharge or hordeolum.     Extraocular Movements:     Right eye: Normal extraocular motion.     Left eye: Normal extraocular motion.     Conjunctiva/sclera:     Right eye: Right conjunctiva is injected.     Left eye: Left conjunctiva is injected.  Cardiovascular:     Rate and Rhythm: Normal rate and regular rhythm.     Heart sounds: Normal heart sounds. No murmur heard. Pulmonary:     Effort: Pulmonary effort is normal. No respiratory distress.     Breath sounds: Normal breath sounds. No wheezing or rales.  Lymphadenopathy:     Cervical: No cervical adenopathy.  Neurological:     Mental Status: She is alert.     No results found for any visits on 03/03/22.      Assessment & Plan:   Elizabeth Nash was seen today for conjunctivitis and chest congestion.  Diagnoses and all orders for this visit:  Acute bacterial conjunctivitis of both eyes Ofloxacin as below. Discussed prevention of spread.  -     ofloxacin ([place])  0.3 % ophthalmic solution; Place 2 drops into both eyes in the morning, at noon, in the evening, and at bedtime for 7 days.  Viral URI Declined flu testing as she is out of the window for tamiflu. Discussed symptomatic care and return precautions.    Return to office for new or worsening symptoms, or if symptoms persist.   The patient indicates understanding of these issues and agrees with the plan.   Gwenlyn Perking, FNP

## 2022-05-15 DIAGNOSIS — R030 Elevated blood-pressure reading, without diagnosis of hypertension: Secondary | ICD-10-CM | POA: Diagnosis not present

## 2022-05-15 DIAGNOSIS — Z882 Allergy status to sulfonamides status: Secondary | ICD-10-CM | POA: Diagnosis not present

## 2022-05-15 DIAGNOSIS — K219 Gastro-esophageal reflux disease without esophagitis: Secondary | ICD-10-CM | POA: Diagnosis not present

## 2022-05-15 DIAGNOSIS — Z87891 Personal history of nicotine dependence: Secondary | ICD-10-CM | POA: Diagnosis not present

## 2022-05-15 DIAGNOSIS — Z833 Family history of diabetes mellitus: Secondary | ICD-10-CM | POA: Diagnosis not present

## 2022-05-15 DIAGNOSIS — Z880 Allergy status to penicillin: Secondary | ICD-10-CM | POA: Diagnosis not present

## 2022-05-15 DIAGNOSIS — Z809 Family history of malignant neoplasm, unspecified: Secondary | ICD-10-CM | POA: Diagnosis not present

## 2022-05-15 DIAGNOSIS — F411 Generalized anxiety disorder: Secondary | ICD-10-CM | POA: Diagnosis not present

## 2022-05-18 ENCOUNTER — Ambulatory Visit: Payer: 59 | Admitting: Nurse Practitioner

## 2022-05-23 ENCOUNTER — Encounter: Payer: Self-pay | Admitting: Nurse Practitioner

## 2022-05-23 ENCOUNTER — Ambulatory Visit (INDEPENDENT_AMBULATORY_CARE_PROVIDER_SITE_OTHER): Payer: 59 | Admitting: Nurse Practitioner

## 2022-05-23 VITALS — BP 124/65 | HR 77 | Temp 97.9°F | Resp 20 | Ht 68.4 in | Wt 146.8 lb

## 2022-05-23 DIAGNOSIS — Z0000 Encounter for general adult medical examination without abnormal findings: Secondary | ICD-10-CM

## 2022-05-23 DIAGNOSIS — F411 Generalized anxiety disorder: Secondary | ICD-10-CM

## 2022-05-23 DIAGNOSIS — K5904 Chronic idiopathic constipation: Secondary | ICD-10-CM | POA: Diagnosis not present

## 2022-05-23 DIAGNOSIS — K640 First degree hemorrhoids: Secondary | ICD-10-CM | POA: Diagnosis not present

## 2022-05-23 DIAGNOSIS — Z0001 Encounter for general adult medical examination with abnormal findings: Secondary | ICD-10-CM | POA: Diagnosis not present

## 2022-05-23 MED ORDER — ALPRAZOLAM 0.5 MG PO TABS: 0.2500 mg | ORAL_TABLET | Freq: Two times a day (BID) | ORAL | 5 refills | Status: DC | PRN

## 2022-05-23 NOTE — Patient Instructions (Signed)
Exercising to Stay Healthy To become healthy and stay healthy, it is recommended that you do moderate-intensity and vigorous-intensity exercise. You can tell that you are exercising at a moderate intensity if your heart starts beating faster and you start breathing faster but can still hold a conversation. You can tell that you are exercising at a vigorous intensity if you are breathing much harder and faster and cannot hold a conversation while exercising. How can exercise benefit me? Exercising regularly is important. It has many health benefits, such as: Improving overall fitness, flexibility, and endurance. Increasing bone density. Helping with weight control. Decreasing body fat. Increasing muscle strength and endurance. Reducing stress and tension, anxiety, depression, or anger. Improving overall health. What guidelines should I follow while exercising? Before you start a new exercise program, talk with your health care provider. Do not exercise so much that you hurt yourself, feel dizzy, or get very short of breath. Wear comfortable clothes and wear shoes with good support. Drink plenty of water while you exercise to prevent dehydration or heat stroke. Work out until your breathing and your heartbeat get faster (moderate intensity). How often should I exercise? Choose an activity that you enjoy, and set realistic goals. Your health care provider can help you make an activity plan that is individually designed and works best for you. Exercise regularly as told by your health care provider. This may include: Doing strength training two times a week, such as: Lifting weights. Using resistance bands. Push-ups. Sit-ups. Yoga. Doing a certain intensity of exercise for a given amount of time. Choose from these options: A total of 150 minutes of moderate-intensity exercise every week. A total of 75 minutes of vigorous-intensity exercise every week. A mix of moderate-intensity and  vigorous-intensity exercise every week. Children, pregnant women, people who have not exercised regularly, people who are overweight, and older adults may need to talk with a health care provider about what activities are safe to perform. If you have a medical condition, be sure to talk with your health care provider before you start a new exercise program. What are some exercise ideas? Moderate-intensity exercise ideas include: Walking 1 mile (1.6 km) in about 15 minutes. Biking. Hiking. Golfing. Dancing. Water aerobics. Vigorous-intensity exercise ideas include: Walking 4.5 miles (7.2 km) or more in about 1 hour. Jogging or running 5 miles (8 km) in about 1 hour. Biking 10 miles (16.1 km) or more in about 1 hour. Lap swimming. Roller-skating or in-line skating. Cross-country skiing. Vigorous competitive sports, such as football, basketball, and soccer. Jumping rope. Aerobic dancing. What are some everyday activities that can help me get exercise? Yard work, such as: Pushing a lawn mower. Raking and bagging leaves. Washing your car. Pushing a stroller. Shoveling snow. Gardening. Washing windows or floors. How can I be more active in my day-to-day activities? Use stairs instead of an elevator. Take a walk during your lunch break. If you drive, park your car farther away from your work or school. If you take public transportation, get off one stop early and walk the rest of the way. Stand up or walk around during all of your indoor phone calls. Get up, stretch, and walk around every 30 minutes throughout the day. Enjoy exercise with a friend. Support to continue exercising will help you keep a regular routine of activity. Where to find more information You can find more information about exercising to stay healthy from: U.S. Department of Health and Human Services: www.hhs.gov Centers for Disease Control and Prevention (  CDC): www.cdc.gov Summary Exercising regularly is  important. It will improve your overall fitness, flexibility, and endurance. Regular exercise will also improve your overall health. It can help you control your weight, reduce stress, and improve your bone density. Do not exercise so much that you hurt yourself, feel dizzy, or get very short of breath. Before you start a new exercise program, talk with your health care provider. This information is not intended to replace advice given to you by your health care provider. Make sure you discuss any questions you have with your health care provider. Document Revised: 06/18/2020 Document Reviewed: 06/18/2020 Elsevier Patient Education  2023 Elsevier Inc.  

## 2022-05-23 NOTE — Progress Notes (Signed)
Subjective:    Patient ID: Elizabeth Nash, female    DOB: 02-02-1956, 67 y.o.   MRN: [id number]   Chief Complaint: Annual Exam (No pap)    HPI:  Elizabeth Nash is a 67 y.o. who identifies as a female who was assigned female at birth.   Social history: Lives with: daughter an 2 kids live with her Work history: retired   Scientist, forensic in today for follow up of the following chronic medical issues:  1. Annual physical exam No PAP  2. GAD (generalized anxiety disorder) Takes xanax mainly at night.    05/23/2022    9:17 AM 03/03/2022    9:49 AM 11/17/2021   10:42 AM 05/16/2021    9:44 AM  GAD 7 : Generalized Anxiety Score  Nervous, Anxious, on Edge 0 0 0 0  Control/stop worrying 0 0 0 0  Worry too much - different things 0 0 0 0  Trouble relaxing 0 0 0 0  Restless 0 0 0 0  Easily annoyed or irritable 0 0 0 0  Afraid - awful might happen 0 0 0 0  Total GAD 7 Score 0 0 0 0  Anxiety Difficulty Not difficult at all Not difficult at all Not difficult at all Not difficult at all      3. Grade I hemorrhoids Doing ok right now  4. Chronic idiopathic constipation Some better. Has not need to tae any linzess. Goes almost daily   New complaints: None today  Allergies  Allergen Reactions   Codeine    Penicillins    Sulfa Antibiotics    Outpatient Encounter Medications as of 05/23/2022  Medication Sig   ALPRAZolam (XANAX) 0.5 MG tablet Take 0.5 tablets (0.25 mg total) by mouth 2 (two) times daily as needed for anxiety.   BIOTIN PO Take by mouth.   Calcium Citrate (CITRACAL PO) Take by mouth.   cetirizine (ZYRTEC) 10 MG tablet Take 10 mg by mouth daily.   Docusate Calcium (STOOL SOFTENER PO) Take by mouth. Reported on 03/05/2015   hydrocortisone (ANUSOL-HC) 25 MG suppository Place 1 suppository (25 mg total) rectally 2 (two) times daily.   nitroGLYCERIN (NITROSTAT) 0.4 MG SL tablet Place 1 tablet (0.4 mg total) under the tongue every 5 (five) minutes as needed for chest pain.    vitamin C (ASCORBIC ACID) 500 MG tablet Take 500 mg by mouth daily.   No facility-administered encounter medications on file as of 05/23/2022.    Past Surgical History:  Procedure Laterality Date   BREAST SURGERY Bilateral 02-21-2012   L-300cc,R-325cc   CESAREAN SECTION     INCISION AND DRAINAGE Right 01/16/2013   Procedure: INCISION AND DRAINAGE;  Surgeon: Tennis Must, MD;  Location: [facility];  Service: Orthopedics;  Laterality: Right;   TUBAL LIGATION      Family History  Problem Relation Age of Onset   Hypertension Mother    Hypothyroidism Mother    Cancer Father        ESOPHAGEAL AND BRAIN      Controlled substance contract: n/a     Review of Systems  Constitutional:  Negative for diaphoresis.  Eyes:  Negative for pain.  Respiratory:  Negative for shortness of breath.   Cardiovascular:  Negative for chest pain, palpitations and leg swelling.  Gastrointestinal:  Negative for abdominal pain.  Endocrine: Negative for polydipsia.  Skin:  Negative for rash.  Neurological:  Negative for dizziness, weakness and headaches.  Hematological:  Does not bruise/bleed easily.  All other systems reviewed and are negative.      Objective:   Physical Exam Vitals and nursing note reviewed.  Constitutional:      General: She is not in acute distress.    Appearance: Normal appearance. She is well-developed.  HENT:     Head: Normocephalic.     Right Ear: Tympanic membrane normal.     Left Ear: Tympanic membrane normal.     Nose: Nose normal.     Mouth/Throat:     Mouth: Mucous membranes are moist.  Eyes:     Pupils: Pupils are equal, round, and reactive to light.  Neck:     Vascular: No carotid bruit or JVD.  Cardiovascular:     Rate and Rhythm: Normal rate and regular rhythm.     Heart sounds: Normal heart sounds.  Pulmonary:     Effort: Pulmonary effort is normal. No respiratory distress.     Breath sounds: Normal breath sounds. No wheezing or  rales.  Chest:     Chest wall: No tenderness.  Abdominal:     General: Bowel sounds are normal. There is no distension or abdominal bruit.     Palpations: Abdomen is soft. There is no hepatomegaly, splenomegaly, mass or pulsatile mass.     Tenderness: There is no abdominal tenderness.  Musculoskeletal:        General: Normal range of motion.     Cervical back: Normal range of motion and neck supple.  Lymphadenopathy:     Cervical: No cervical adenopathy.  Skin:    General: Skin is warm and dry.  Neurological:     Mental Status: She is alert and oriented to person, place, and time.     Deep Tendon Reflexes: Reflexes are normal and symmetric.  Psychiatric:        Behavior: Behavior normal.        Thought Content: Thought content normal.        Judgment: Judgment normal.    BP 124/65   Pulse 77   Temp 97.9 F (36.6 C) (Temporal)   Resp 20   Ht 5' 8.4" (1.737 m)   Wt 146 lb 12.8 oz (66.6 kg)   SpO2 97%   BMI 22.06 kg/m   EKG- NSR-Mary-Margaret Hassell Done, FNP         Assessment & Plan:   [name] comes in today with chief complaint of Annual Exam (No pap)   Diagnosis and orders addressed:  1. Annual physical exam  - CBC with Differential/Platelet - CMP14+EGFR - Lipid panel - Thyroid Panel With TSH - VITAMIN D 25 Hydroxy (Vit-D Deficiency, Fractures) - EKG 12-Lead  2. GAD (generalized anxiety disorder) Stress management - ALPRAZolam (XANAX) 0.5 MG tablet; Take 0.5 tablets (0.25 mg total) by mouth 2 (two) times daily as needed for anxiety.  Dispense: 60 tablet; Refill: 5  3. Grade I hemorrhoids Avoid straining to =have bowel improvement  4. Chronic idiopathic constipation Report any issues   Labs pending Health Maintenance reviewed Diet and exercise encouraged  Follow up plan: 1 year   Castine, FNP

## 2022-05-24 LAB — CBC WITH DIFFERENTIAL/PLATELET
Basophils Absolute: 0.1 10*3/uL (ref 0.0–0.2)
Basos: 1 %
EOS (ABSOLUTE): 0.3 10*3/uL (ref 0.0–0.4)
Eos: 5 %
Hematocrit: 40.4 % (ref 34.0–46.6)
Hemoglobin: 13.6 g/dL (ref 11.1–15.9)
Immature Grans (Abs): 0 10*3/uL (ref 0.0–0.1)
Immature Granulocytes: 0 %
Lymphocytes Absolute: 1.9 10*3/uL (ref 0.7–3.1)
Lymphs: 34 %
MCH: 30.9 pg (ref 26.6–33.0)
MCHC: 33.7 g/dL (ref 31.5–35.7)
MCV: 92 fL (ref 79–97)
Monocytes Absolute: 0.5 10*3/uL (ref 0.1–0.9)
Monocytes: 8 %
Neutrophils Absolute: 2.9 10*3/uL (ref 1.4–7.0)
Neutrophils: 52 %
Platelets: 278 10*3/uL (ref 150–450)
RBC: 4.4 x10E6/uL (ref 3.77–5.28)
RDW: 12.1 % (ref 11.7–15.4)
WBC: 5.6 10*3/uL (ref 3.4–10.8)

## 2022-05-24 LAB — CMP14+EGFR
ALT: 14 IU/L (ref 0–32)
AST: 12 IU/L (ref 0–40)
Albumin/Globulin Ratio: 1.9 (ref 1.2–2.2)
Albumin: 4.5 g/dL (ref 3.9–4.9)
Alkaline Phosphatase: 79 IU/L (ref 44–121)
BUN/Creatinine Ratio: 19 (ref 12–28)
BUN: 13 mg/dL (ref 8–27)
Bilirubin Total: 0.4 mg/dL (ref 0.0–1.2)
CO2: 23 mmol/L (ref 20–29)
Calcium: 9.6 mg/dL (ref 8.7–10.3)
Chloride: 103 mmol/L (ref 96–106)
Creatinine, Ser: 0.7 mg/dL (ref 0.57–1.00)
Globulin, Total: 2.4 g/dL (ref 1.5–4.5)
Glucose: 93 mg/dL (ref 70–99)
Potassium: 5.1 mmol/L (ref 3.5–5.2)
Sodium: 140 mmol/L (ref 134–144)
Total Protein: 6.9 g/dL (ref 6.0–8.5)
eGFR: 97 mL/min/{1.73_m2} (ref >59)

## 2022-05-24 LAB — THYROID PANEL WITH TSH
Free Thyroxine Index: 1.7 (ref 1.2–4.9)
T3 Uptake Ratio: 27 % (ref 24–39)
T4, Total: 6.3 ug/dL (ref 4.5–12.0)
TSH: 2.23 u[IU]/mL (ref 0.450–4.500)

## 2022-05-24 LAB — LIPID PANEL
Chol/HDL Ratio: 2.2 ratio (ref 0.0–4.4)
Cholesterol, Total: 196 mg/dL (ref 100–199)
HDL: 89 mg/dL (ref >39)
LDL Chol Calc (NIH): 91 mg/dL (ref 0–99)
Triglycerides: 93 mg/dL (ref 0–149)
VLDL Cholesterol Cal: 16 mg/dL (ref 5–40)

## 2022-05-24 LAB — VITAMIN D 25 HYDROXY (VIT D DEFICIENCY, FRACTURES): Vit D, 25-Hydroxy: 40.8 ng/mL (ref 30.0–100.0)

## 2022-06-20 DIAGNOSIS — L814 Other melanin hyperpigmentation: Secondary | ICD-10-CM | POA: Diagnosis not present

## 2022-06-20 DIAGNOSIS — D485 Neoplasm of uncertain behavior of skin: Secondary | ICD-10-CM | POA: Diagnosis not present

## 2022-06-20 DIAGNOSIS — D225 Melanocytic nevi of trunk: Secondary | ICD-10-CM | POA: Diagnosis not present

## 2022-06-20 DIAGNOSIS — L821 Other seborrheic keratosis: Secondary | ICD-10-CM | POA: Diagnosis not present

## 2022-06-20 DIAGNOSIS — L570 Actinic keratosis: Secondary | ICD-10-CM | POA: Diagnosis not present

## 2022-06-20 DIAGNOSIS — D2239 Melanocytic nevi of other parts of face: Secondary | ICD-10-CM | POA: Diagnosis not present

## 2022-11-23 ENCOUNTER — Encounter: Payer: Self-pay | Admitting: Nurse Practitioner

## 2022-11-23 ENCOUNTER — Other Ambulatory Visit: Payer: HMO

## 2022-11-23 ENCOUNTER — Ambulatory Visit (INDEPENDENT_AMBULATORY_CARE_PROVIDER_SITE_OTHER): Payer: HMO | Admitting: Nurse Practitioner

## 2022-11-23 VITALS — BP 136/75 | HR 56 | Temp 97.8°F | Resp 20 | Ht 68.0 in | Wt 144.0 lb

## 2022-11-23 DIAGNOSIS — F411 Generalized anxiety disorder: Secondary | ICD-10-CM

## 2022-11-23 DIAGNOSIS — K5904 Chronic idiopathic constipation: Secondary | ICD-10-CM | POA: Diagnosis not present

## 2022-11-23 DIAGNOSIS — K641 Second degree hemorrhoids: Secondary | ICD-10-CM | POA: Diagnosis not present

## 2022-11-23 LAB — CBC WITH DIFFERENTIAL/PLATELET
Basophils Absolute: 0.1 10*3/uL (ref 0.0–0.2)
Basos: 1 %
EOS (ABSOLUTE): 0.1 10*3/uL (ref 0.0–0.4)
Eos: 2 %
Hematocrit: 39 % (ref 34.0–46.6)
Hemoglobin: 12.7 g/dL (ref 11.1–15.9)
Immature Grans (Abs): 0 10*3/uL (ref 0.0–0.1)
Immature Granulocytes: 0 %
Lymphocytes Absolute: 2.1 10*3/uL (ref 0.7–3.1)
Lymphs: 30 %
MCH: 31.4 pg (ref 26.6–33.0)
MCHC: 32.6 g/dL (ref 31.5–35.7)
MCV: 97 fL (ref 79–97)
Monocytes Absolute: 0.5 10*3/uL (ref 0.1–0.9)
Monocytes: 6 %
Neutrophils Absolute: 4.3 10*3/uL (ref 1.4–7.0)
Neutrophils: 61 %
Platelets: 329 10*3/uL (ref 150–450)
RBC: 4.04 x10E6/uL (ref 3.77–5.28)
RDW: 11.7 % (ref 11.7–15.4)
WBC: 7.1 10*3/uL (ref 3.4–10.8)

## 2022-11-23 LAB — CMP14+EGFR
ALT: 14 IU/L (ref 0–32)
AST: 16 IU/L (ref 0–40)
Albumin: 4 g/dL (ref 3.9–4.9)
Alkaline Phosphatase: 77 IU/L (ref 44–121)
BUN/Creatinine Ratio: 17 (ref 12–28)
BUN: 12 mg/dL (ref 8–27)
Bilirubin Total: 0.4 mg/dL (ref 0.0–1.2)
CO2: 22 mmol/L (ref 20–29)
Calcium: 9.4 mg/dL (ref 8.7–10.3)
Chloride: 101 mmol/L (ref 96–106)
Creatinine, Ser: 0.69 mg/dL (ref 0.57–1.00)
Globulin, Total: 3.2 g/dL (ref 1.5–4.5)
Glucose: 90 mg/dL (ref 70–99)
Potassium: 4.5 mmol/L (ref 3.5–5.2)
Sodium: 139 mmol/L (ref 134–144)
Total Protein: 7.2 g/dL (ref 6.0–8.5)
eGFR: 96 mL/min/{1.73_m2} (ref >59)

## 2022-11-23 LAB — LIPID PANEL
Chol/HDL Ratio: 2.6 ratio (ref 0.0–4.4)
Cholesterol, Total: 180 mg/dL (ref 100–199)
HDL: 68 mg/dL (ref >39)
LDL Chol Calc (NIH): 99 mg/dL (ref 0–99)
Triglycerides: 70 mg/dL (ref 0–149)
VLDL Cholesterol Cal: 13 mg/dL (ref 5–40)

## 2022-11-23 MED ORDER — HYDROCORTISONE ACETATE 25 MG RE SUPP
25.0000 mg | Freq: Two times a day (BID) | RECTAL | 0 refills | Status: AC
Start: 2022-11-23 — End: ?

## 2022-11-23 MED ORDER — ALPRAZOLAM 0.5 MG PO TABS: 0.2500 mg | ORAL_TABLET | Freq: Two times a day (BID) | ORAL | 5 refills | Status: DC | PRN

## 2022-11-23 NOTE — Patient Instructions (Signed)

## 2022-11-23 NOTE — Progress Notes (Signed)
Subjective:    Patient ID: Elizabeth Nash, female    DOB: Feb 17, 1956, 67 y.o.   MRN: [id number]   Chief Complaint: Medical Management of Chronic Issues    HPI:  Elizabeth Nash is a 67 y.o. who identifies as a female who was assigned female at birth.   Social history: Lives with: daughter Work history: retired   Water engineer in today for follow up of the following chronic medical issues:  1. GAD (generalized anxiety disorder) Has been on xanax for many years  2. Chronic idiopathic constipation Has to take dulcolax in order to go to the restroom   New complaints: None today  Allergies  Allergen Reactions   Codeine    Penicillins    Sulfa Antibiotics    Outpatient Encounter Medications as of 11/23/2022  Medication Sig   ALPRAZolam (XANAX) 0.5 MG tablet Take 0.5 tablets (0.25 mg total) by mouth 2 (two) times daily as needed for anxiety.   BIOTIN PO Take by mouth.   Calcium Citrate (CITRACAL PO) Take by mouth.   cetirizine (ZYRTEC) 10 MG tablet Take 10 mg by mouth daily.   Docusate Calcium (STOOL SOFTENER PO) Take by mouth. Reported on 03/05/2015   hydrocortisone (ANUSOL-HC) 25 MG suppository Place 1 suppository (25 mg total) rectally 2 (two) times daily.   nitroGLYCERIN (NITROSTAT) 0.4 MG SL tablet Place 1 tablet (0.4 mg total) under the tongue every 5 (five) minutes as needed for chest pain.   vitamin C (ASCORBIC ACID) 500 MG tablet Take 500 mg by mouth daily.   No facility-administered encounter medications on file as of 11/23/2022.    Past Surgical History:  Procedure Laterality Date   BREAST SURGERY Bilateral 02-21-2012   L-300cc,R-325cc   CESAREAN SECTION     INCISION AND DRAINAGE Right 01/16/2013   Procedure: INCISION AND DRAINAGE;  Surgeon: Tami Ribas, MD;  Location: [facility];  Service: Orthopedics;  Laterality: Right;   TUBAL LIGATION      Family History  Problem Relation Age of Onset   Hypertension Mother    Hypothyroidism Mother    Cancer  Father        ESOPHAGEAL AND BRAIN      Controlled substance contract: n/a     Review of Systems  Constitutional:  Negative for diaphoresis.  Eyes:  Negative for pain.  Respiratory:  Negative for shortness of breath.   Cardiovascular:  Negative for chest pain, palpitations and leg swelling.  Gastrointestinal:  Negative for abdominal pain.  Endocrine: Negative for polydipsia.  Skin:  Negative for rash.  Neurological:  Negative for dizziness, weakness and headaches.  Hematological:  Does not bruise/bleed easily.  All other systems reviewed and are negative.      Objective:   Physical Exam Vitals and nursing note reviewed.  Constitutional:      General: She is not in acute distress.    Appearance: Normal appearance. She is well-developed.  HENT:     Head: Normocephalic.     Right Ear: Tympanic membrane normal.     Left Ear: Tympanic membrane normal.     Nose: Nose normal.     Mouth/Throat:     Mouth: Mucous membranes are moist.  Eyes:     Pupils: Pupils are equal, round, and reactive to light.  Neck:     Vascular: No carotid bruit or JVD.  Cardiovascular:     Rate and Rhythm: Normal rate and regular rhythm.     Heart sounds: Normal heart sounds.  Pulmonary:  Effort: Pulmonary effort is normal. No respiratory distress.     Breath sounds: Normal breath sounds. No wheezing or rales.  Chest:     Chest wall: No tenderness.  Abdominal:     General: Bowel sounds are normal. There is no distension or abdominal bruit.     Palpations: Abdomen is soft. There is no hepatomegaly, splenomegaly, mass or pulsatile mass.     Tenderness: There is no abdominal tenderness.  Musculoskeletal:        General: Normal range of motion.     Cervical back: Normal range of motion and neck supple.  Lymphadenopathy:     Cervical: No cervical adenopathy.  Skin:    General: Skin is warm and dry.  Neurological:     Mental Status: She is alert and oriented to person, place, and time.      Deep Tendon Reflexes: Reflexes are normal and symmetric.  Psychiatric:        Behavior: Behavior normal.        Thought Content: Thought content normal.        Judgment: Judgment normal.    BP 136/75   Pulse (!) 56   Temp 97.8 F (36.6 C) (Temporal)   Resp 20   Ht 5\' 8"  (1.727 m)   Wt 144 lb (65.3 kg)   SpO2 99%   BMI 21.90 kg/m         Assessment & Plan:   Elizabeth Nash comes in today with chief complaint of Medical Management of Chronic Issues   Diagnosis and orders addressed:  1. GAD (generalized anxiety disorder) Stress management - ToxASSURE Select 13 (MW), Urine - ALPRAZolam (XANAX) 0.5 MG tablet; Take 0.5 tablets (0.25 mg total) by mouth 2 (two) times daily as needed for anxiety.  Dispense: 60 tablet; Refill: 5  2. Chronic idiopathic constipation Referral to GI - Ambulatory referral to Gastroenterology - CBC with Differential/Platelet - CMP14+EGFR - Lipid panel   Labs pending Health Maintenance reviewed Diet and exercise encouraged  Follow up plan: 6 months   Mary-Margaret Daphine Deutscher, FNP

## 2022-11-30 LAB — TOXASSURE SELECT 13 (MW), URINE

## 2022-12-07 ENCOUNTER — Telehealth: Payer: Self-pay | Admitting: Gastroenterology

## 2022-12-07 NOTE — Telephone Encounter (Signed)
Good morning Dr. Lavon Paganini,   Supervising Provider 10/3 AM  We received a referral for this patient to be scheduled for a colonoscopy. Patient had procedure in 2021 with Dr. Garrison Columbus at [facility]. she states she was told she would need a two day prep in the future. Patient was not happy with the care received there and would like to establish with [facility]. Records were scanned into Media for your review. Would you please advise on scheduling?  Thank you.

## 2022-12-08 NOTE — Telephone Encounter (Signed)
Request received to transfer GI care from outside practice to [facility].  We appreciate the interest in our practice, however at this time due to high demand from patients without established GI providers we cannot accommodate this transfer.      

## 2023-01-11 ENCOUNTER — Telehealth: Payer: Self-pay

## 2023-01-11 NOTE — Telephone Encounter (Signed)
Called and let patient know tha DXA machine is currently down but if it is fixed by her appt on 11/14 we wll do scan that day   Copied from CRM #[id number]. Topic: General - Other >> Jan 10, 2023  2:06 PM Elizabeth Nash wrote: Reason for CRM: Pt would like to schedule a  bone density test.

## 2023-01-18 ENCOUNTER — Ambulatory Visit: Payer: HMO | Admitting: Nurse Practitioner

## 2023-01-18 ENCOUNTER — Encounter: Payer: Self-pay | Admitting: Nurse Practitioner

## 2023-01-18 VITALS — BP 148/78 | HR 56 | Temp 97.4°F | Ht 68.0 in | Wt 144.6 lb

## 2023-01-18 DIAGNOSIS — F411 Generalized anxiety disorder: Secondary | ICD-10-CM

## 2023-01-18 DIAGNOSIS — I10 Essential (primary) hypertension: Secondary | ICD-10-CM

## 2023-01-18 DIAGNOSIS — K5904 Chronic idiopathic constipation: Secondary | ICD-10-CM

## 2023-01-18 LAB — CBC WITH DIFFERENTIAL/PLATELET
Basophils Absolute: 0 10*3/uL (ref 0.0–0.2)
Basos: 1 %
EOS (ABSOLUTE): 0.2 10*3/uL (ref 0.0–0.4)
Eos: 3 %
Hematocrit: 41.6 % (ref 34.0–46.6)
Hemoglobin: 13.9 g/dL (ref 11.1–15.9)
Immature Grans (Abs): 0 10*3/uL (ref 0.0–0.1)
Immature Granulocytes: 0 %
Lymphocytes Absolute: 1.8 10*3/uL (ref 0.7–3.1)
Lymphs: 31 %
MCH: 32 pg (ref 26.6–33.0)
MCHC: 33.4 g/dL (ref 31.5–35.7)
MCV: 96 fL (ref 79–97)
Monocytes Absolute: 0.5 10*3/uL (ref 0.1–0.9)
Monocytes: 8 %
Neutrophils Absolute: 3.4 10*3/uL (ref 1.4–7.0)
Neutrophils: 57 %
Platelets: 240 10*3/uL (ref 150–450)
RBC: 4.35 x10E6/uL (ref 3.77–5.28)
RDW: 11.7 % (ref 11.7–15.4)
WBC: 5.9 10*3/uL (ref 3.4–10.8)

## 2023-01-18 LAB — CMP14+EGFR
ALT: 15 [IU]/L (ref 0–32)
AST: 16 [IU]/L (ref 0–40)
Albumin: 4.6 g/dL (ref 3.9–4.9)
Alkaline Phosphatase: 74 [IU]/L (ref 44–121)
BUN/Creatinine Ratio: 23 (ref 12–28)
BUN: 16 mg/dL (ref 8–27)
Bilirubin Total: 0.4 mg/dL (ref 0.0–1.2)
CO2: 23 mmol/L (ref 20–29)
Calcium: 9.7 mg/dL (ref 8.7–10.3)
Chloride: 103 mmol/L (ref 96–106)
Creatinine, Ser: 0.69 mg/dL (ref 0.57–1.00)
Globulin, Total: 2.6 g/dL (ref 1.5–4.5)
Glucose: 86 mg/dL (ref 70–99)
Potassium: 4.7 mmol/L (ref 3.5–5.2)
Sodium: 142 mmol/L (ref 134–144)
Total Protein: 7.2 g/dL (ref 6.0–8.5)
eGFR: 96 mL/min/{1.73_m2} (ref >59)

## 2023-01-18 LAB — LIPID PANEL
Chol/HDL Ratio: 2.2 ratio (ref 0.0–4.4)
Cholesterol, Total: 191 mg/dL (ref 100–199)
HDL: 88 mg/dL (ref >39)
LDL Chol Calc (NIH): 89 mg/dL (ref 0–99)
Triglycerides: 75 mg/dL (ref 0–149)
VLDL Cholesterol Cal: 14 mg/dL (ref 5–40)

## 2023-01-18 MED ORDER — ALPRAZOLAM 0.5 MG PO TABS
0.5000 mg | ORAL_TABLET | Freq: Two times a day (BID) | ORAL | 5 refills | Status: DC | PRN
Start: 2023-01-18 — End: 2023-08-03

## 2023-01-18 MED ORDER — LISINOPRIL 20 MG PO TABS
20.0000 mg | ORAL_TABLET | Freq: Every day | ORAL | 1 refills | Status: DC
Start: 2023-01-18 — End: 2023-07-03

## 2023-01-18 NOTE — Addendum Note (Signed)
Addended by: Cleda Daub on: 01/18/2023 09:09 AM   Modules accepted: Orders

## 2023-01-18 NOTE — Progress Notes (Signed)
Subjective:    Patient ID: Elizabeth Nash, female    DOB: 09-Apr-1955, 67 y.o.   MRN: [id number]   Chief Complaint: medical management of chronic issues     HPI:  Elizabeth Nash is a 67 y.o. who identifies as a female who was assigned female at birth.   Social history: Lives with: Elizabeth Nash daughter Work history: retired   Water engineer in today for follow up of the following chronic medical issues:  1. GAD (generalized anxiety disorder) Is on xanax BID and is doing well. .    01/18/2023    8:39 AM 11/23/2022    9:49 AM 05/23/2022    9:17 AM 03/03/2022    9:49 AM  GAD 7 : Generalized Anxiety Score  Nervous, Anxious, on Edge 1 0 0 0  Control/stop worrying 0 0 0 0  Worry too much - different things 0 0 0 0  Trouble relaxing 0 0 0 0  Restless 0 0 0 0  Easily annoyed or irritable 0 0 0 0  Afraid - awful might happen 0 0 0 0  Total GAD 7 Score 1 0 0 0  Anxiety Difficulty Not difficult at all Not difficult at all Not difficult at all Not difficult at all      2. Chronic idiopathic constipation Has had constipation Elizabeth Nash whole life. Takes dulcolax as needed   New complaints: -Ringing in ears with slight dizziness - blood pressure has been running above 140 systolic the last several times she has checked it.   Allergies  Allergen Reactions   Codeine    Penicillins    Sulfa Antibiotics    Outpatient Encounter Medications as of 01/18/2023  Medication Sig   ALPRAZolam (XANAX) 0.5 MG tablet Take 0.5 tablets (0.25 mg total) by mouth 2 (two) times daily as needed for anxiety.   BIOTIN PO Take by mouth.   Calcium Citrate (CITRACAL PO) Take by mouth.   cetirizine (ZYRTEC) 10 MG tablet Take 10 mg by mouth daily.   Docusate Calcium (STOOL SOFTENER PO) Take by mouth. Reported on 03/05/2015   hydrocortisone (ANUSOL-HC) 25 MG suppository Place 1 suppository (25 mg total) rectally 2 (two) times daily.   nitroGLYCERIN (NITROSTAT) 0.4 MG SL tablet Place 1 tablet (0.4 mg total) under the tongue every  5 (five) minutes as needed for chest pain.   vitamin C (ASCORBIC ACID) 500 MG tablet Take 500 mg by mouth daily.   No facility-administered encounter medications on file as of 01/18/2023.    Past Surgical History:  Procedure Laterality Date   BREAST SURGERY Bilateral 02-21-2012   L-300cc,R-325cc   CESAREAN SECTION     INCISION AND DRAINAGE Right 01/16/2013   Procedure: INCISION AND DRAINAGE;  Surgeon: Tami Ribas, MD;  Location: [facility];  Service: Orthopedics;  Laterality: Right;   TUBAL LIGATION      Family History  Problem Relation Age of Onset   Hypertension Mother    Hypothyroidism Mother    Cancer Father        ESOPHAGEAL AND BRAIN      Controlled substance contract: 12/01/22      Review of Systems  Constitutional:  Negative for diaphoresis.  Eyes:  Negative for pain.  Respiratory:  Negative for shortness of breath.   Cardiovascular:  Negative for chest pain, palpitations and leg swelling.  Gastrointestinal:  Negative for abdominal pain.  Endocrine: Negative for polydipsia.  Skin:  Negative for rash.  Neurological:  Negative for dizziness, weakness and headaches.  Hematological:  Does not bruise/bleed easily.  Psychiatric/Behavioral:  Negative for sleep disturbance (ringing in ears).   All other systems reviewed and are negative.      Objective:   Physical Exam Vitals and nursing note reviewed.  Constitutional:      General: She is not in acute distress.    Appearance: Normal appearance. She is well-developed.  HENT:     Head: Normocephalic.     Right Ear: Tympanic membrane normal.     Left Ear: Tympanic membrane normal.     Nose: Nose normal.     Mouth/Throat:     Mouth: Mucous membranes are moist.  Eyes:     Pupils: Pupils are equal, round, and reactive to light.  Neck:     Vascular: No carotid bruit or JVD.  Cardiovascular:     Rate and Rhythm: Normal rate and regular rhythm.     Heart sounds: Normal heart sounds.   Pulmonary:     Effort: Pulmonary effort is normal. No respiratory distress.     Breath sounds: Normal breath sounds. No wheezing or rales.  Chest:     Chest wall: No tenderness.  Abdominal:     General: Bowel sounds are normal. There is no distension or abdominal bruit.     Palpations: Abdomen is soft. There is no hepatomegaly, splenomegaly, mass or pulsatile mass.     Tenderness: There is no abdominal tenderness.  Musculoskeletal:        General: Normal range of motion.     Cervical back: Normal range of motion and neck supple.  Lymphadenopathy:     Cervical: No cervical adenopathy.  Skin:    General: Skin is warm and dry.  Neurological:     Mental Status: She is alert and oriented to person, place, and time.     Deep Tendon Reflexes: Reflexes are normal and symmetric.  Psychiatric:        Behavior: Behavior normal.        Thought Content: Thought content normal.        Judgment: Judgment normal.    BP (!) 175/72   Pulse (!) 56   Temp (!) 97.4 F (36.3 C)   Ht 5\' 8"  (1.727 m)   Wt 144 lb 9.6 oz (65.6 kg)   SpO2 98%   BMI 21.99 kg/m         Assessment & Plan:   Elizabeth Nash comes in today with chief complaint of Medical Management of Chronic Issues (Dizziness and ringing in ears. )   Diagnosis and orders addressed:  1. GAD (generalized anxiety disorder) Stress management - ALPRAZolam (XANAX) 0.5 MG tablet; Take 1 tablet (0.5 mg total) by mouth 2 (two) times daily as needed for anxiety.  Dispense: 60 tablet; Refill: 5  2. Chronic idiopathic constipation Continue dulcolax as needed Increase fiber in diet  3. Primary hypertension Low sodium diet - lisinopril (ZESTRIL) 20 MG tablet; Take 1 tablet (20 mg total) by mouth daily.  Dispense: 90 tablet; Refill: 1   Labs pending Health Maintenance reviewed Diet and exercise encouraged  Follow up plan: 6 months   Mary-Margaret Daphine Deutscher, FNP

## 2023-01-18 NOTE — Patient Instructions (Signed)

## 2023-01-24 IMAGING — CT CT HEART MORP W/ CTA COR W/ SCORE W/ CA W/CM &/OR W/O CM
4 of 7 series · 8 of 20 positions shown, 9 images · IV contrast (APPLIED)
Comparison: None.
COMPARISON: None.

Addendum:
EXAM:
OVER-READ INTERPRETATION  CT CHEST

The following report is an over-read performed by radiologist Dr.
over-read does not include interpretation of cardiac or coronary
anatomy or pathology. The coronary CTA interpretation by the
cardiologist is attached.
CLINICAL DATA: Chest pain
Cardiac/Coronary CTA
TECHNIQUE: The patient was scanned on a Phillips Force scanner. A 110 kV
prospective scan was triggered in the descending thoracic aorta at
111 HU's. Axial non-contrast 3 mm slices were carried out through
the heart. The data set was analyzed on a dedicated work station and
scored using the Agatson method. Gantry rotation speed was 250 msecs
and collimation was .6 mm. No beta blockade and 0.8 mg of sl NTG was
given. The 3D data set was reconstructed in 5% intervals of the
35-75 % of the R-R cycle. Diastolic phases were analyzed on a
dedicated work station using MPR, MIP and VRT modes. The patient
received 80 cc of contrast.

[Series 6: best diast 72 % · axial · 0.39mm/px · z∈[+1244,+1287]mm · 2 of 328 slices shown]
[im 110/328  vessel]
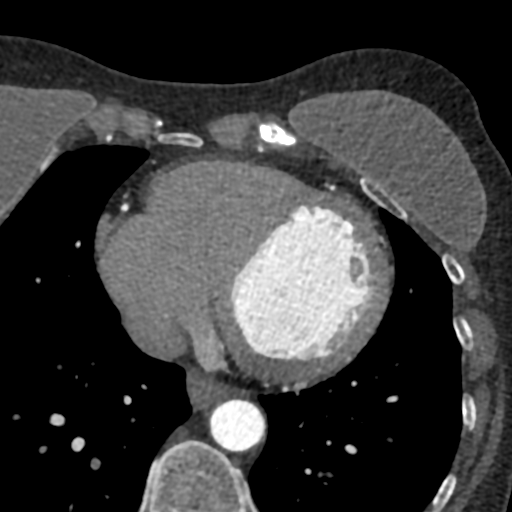
[im 219/328  vessel]
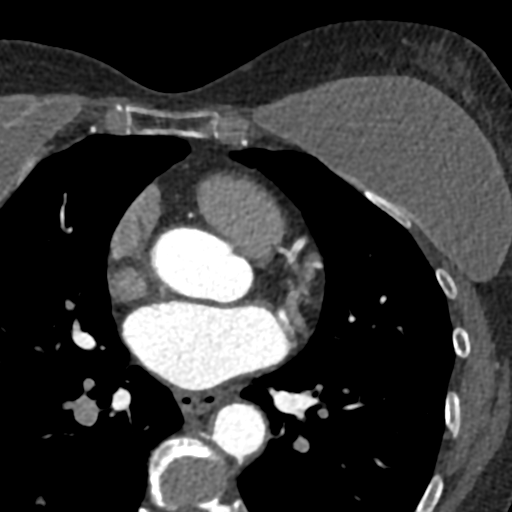

[Series 7: best syst · axial · 0.39mm/px · z∈[+1244,+1287]mm · 2 of 328 slices shown, 3 images]
[im 110/328  vessel]
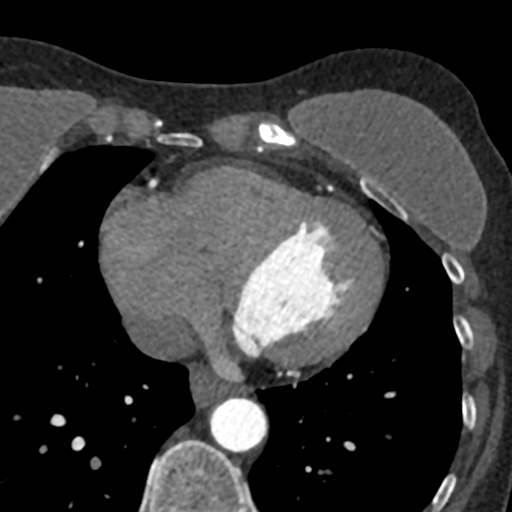
[im 110/328  lung]
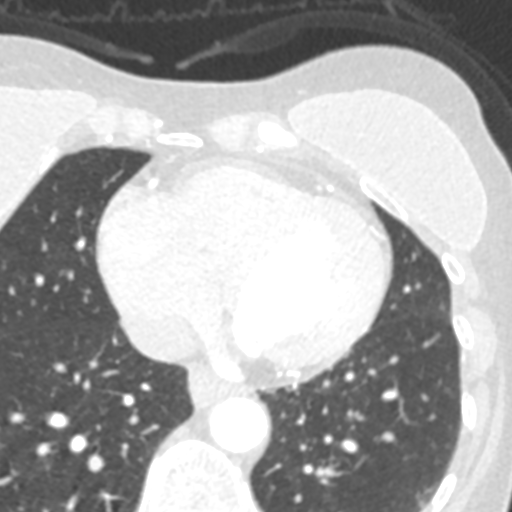
[im 219/328  vessel]
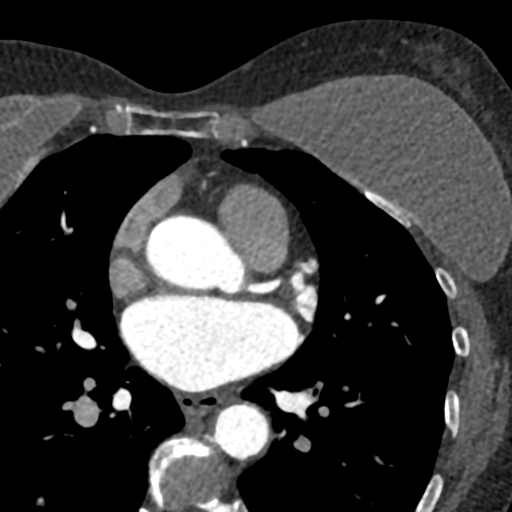

[Series 8: ts diast sharp 72 % · axial · 0.39mm/px · z∈[+1244,+1287]mm · 2 of 328 slices shown]
[im 110/328  lung]
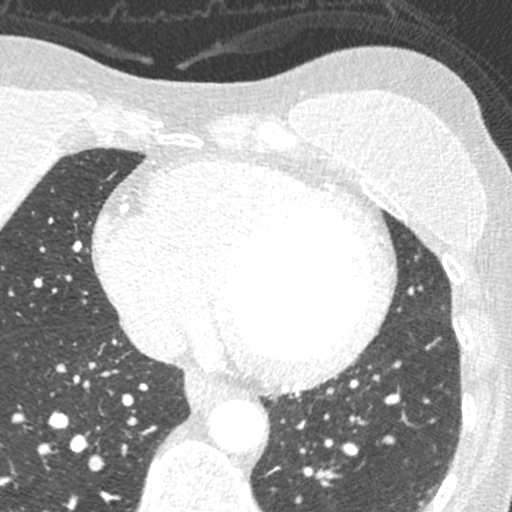
[im 219/328  lung]
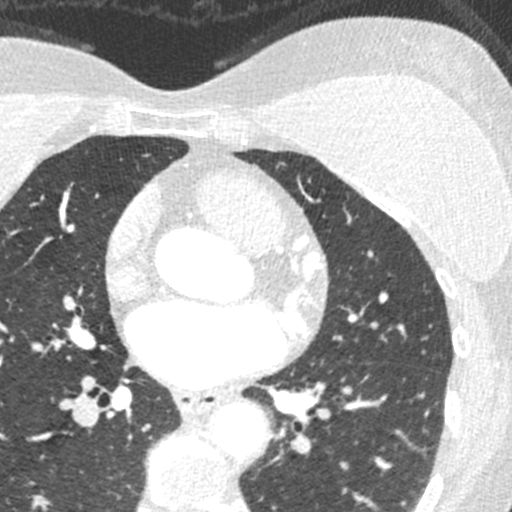

[Series 9: ts syst sharp · axial · 0.39mm/px · z∈[+1244,+1287]mm · 2 of 328 slices shown]
[im 110/328  lung]
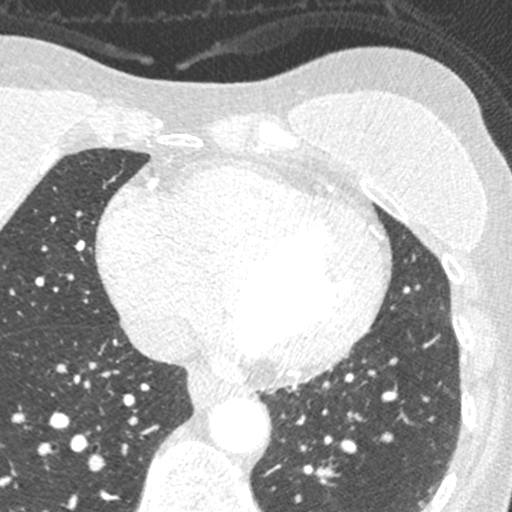
[im 219/328  lung]
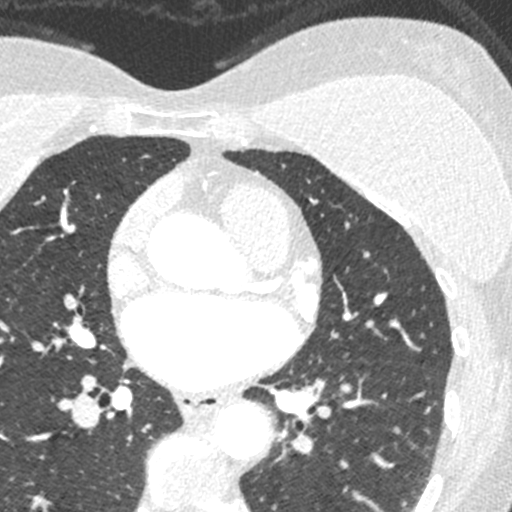

[8 of 20 positions shown; findings below may reference images not displayed]

FINDINGS: Vascular: No incidental findings.

Mediastinum/Nodes: Visualized mediastinum and hilar regions
demonstrate no lymphadenopathy masses.

Lungs/Pleura: Visualized lungs show no evidence of pulmonary edema,
consolidation, pneumothorax, nodule or pleural fluid.

Upper Abdomen: No acute abnormality.

Musculoskeletal: No bony abnormalities identified. Irregularity
along the superior capsular margin of an indwelling right breast
implant may be consistent with component of mild intracapsular
rupture.
IMPRESSION: Mild irregularity of the superior capsule of an indwelling right
breast implant may be consistent with a component of mild
intracapsular rupture.
FINDINGS: Image quality: excellent.

Noise artifact is: Limited.

Coronary Arteries:  Normal coronary origin.  Right dominance.

Left main: The left main is a large caliber vessel with a normal
take off from the left coronary cusp that bifurcates to form a left
anterior descending artery and a left circumflex artery. There is no
plaque or stenosis.

Left anterior descending artery: The LAD is patent without evidence
of plaque or stenosis. The LAD gives off 2 patent diagonal branches.

Left circumflex artery: The LCX is non-dominant and patent with no
evidence of plaque or stenosis. The LCX gives off 2 patent obtuse
marginal branches.

Right coronary artery: The RCA is dominant with normal take off from
the right coronary cusp. There is no evidence of plaque or stenosis.
The RCA terminates as a PDA without evidence of plaque or stenosis.

Right Atrium: Right atrial size is within normal limits.

Right Ventricle: The right ventricular cavity is within normal
limits.

Left Atrium: Left atrial size is normal in size with no left atrial
appendage filling defect.

Left Ventricle: The ventricular cavity size is within normal limits.
There are no stigmata of prior infarction. There is no abnormal
filling defect.

Pulmonary arteries: Normal in size without proximal filling defect.

Pulmonary veins: Normal pulmonary venous drainage.

Pericardium: Normal thickness with no significant effusion or
calcium present.

Cardiac valves: The aortic valve is trileaflet without significant
calcification. The mitral valve is normal structure without
significant calcification.

Aorta: Normal caliber with no significant disease.

Extra-cardiac findings: See attached radiology report for
non-cardiac structures.
IMPRESSION: 1. Coronary calcium score of 0.

2. Normal coronary origin with right dominance.

3. Normal coronary arteries.

RECOMMENDATIONS:
1. No evidence of CAD (0%). Consider non-atherosclerotic causes of
chest pain.

*** End of Addendum ***
EXAM:
OVER-READ INTERPRETATION  CT CHEST

The following report is an over-read performed by radiologist Dr.
over-read does not include interpretation of cardiac or coronary
anatomy or pathology. The coronary CTA interpretation by the
cardiologist is attached.
FINDINGS: Vascular: No incidental findings.

Mediastinum/Nodes: Visualized mediastinum and hilar regions
demonstrate no lymphadenopathy masses.

Lungs/Pleura: Visualized lungs show no evidence of pulmonary edema,
consolidation, pneumothorax, nodule or pleural fluid.

Upper Abdomen: No acute abnormality.

Musculoskeletal: No bony abnormalities identified. Irregularity
along the superior capsular margin of an indwelling right breast
implant may be consistent with component of mild intracapsular
rupture.
IMPRESSION: Mild irregularity of the superior capsule of an indwelling right
breast implant may be consistent with a component of mild
intracapsular rupture.

## 2023-02-05 ENCOUNTER — Telehealth: Payer: Self-pay | Admitting: Family Medicine

## 2023-02-05 DIAGNOSIS — H9313 Tinnitus, bilateral: Secondary | ICD-10-CM

## 2023-02-05 NOTE — Telephone Encounter (Signed)
Copied from CRM [id number]. Topic: Referral - Question >> Feb 05, 2023 12:35 PM Larwance Sachs wrote: Reason for CRM: Patient called in to inform [facility] that blood pressure medicine has not stopped the ringing in her ears. Patient stated she would be interested in getting a referral to see a otalaryngologist for ear ringing before the year ends. Patient asked to be called back with an update once referral process starts at [phone]

## 2023-02-12 ENCOUNTER — Encounter (INDEPENDENT_AMBULATORY_CARE_PROVIDER_SITE_OTHER): Payer: Self-pay | Admitting: Otolaryngology

## 2023-02-16 ENCOUNTER — Ambulatory Visit (INDEPENDENT_AMBULATORY_CARE_PROVIDER_SITE_OTHER): Payer: HMO | Admitting: Otolaryngology

## 2023-02-16 ENCOUNTER — Encounter (INDEPENDENT_AMBULATORY_CARE_PROVIDER_SITE_OTHER): Payer: Self-pay

## 2023-02-16 ENCOUNTER — Ambulatory Visit (INDEPENDENT_AMBULATORY_CARE_PROVIDER_SITE_OTHER): Payer: HMO | Admitting: Audiology

## 2023-02-16 VITALS — Ht 67.0 in | Wt 142.0 lb

## 2023-02-16 DIAGNOSIS — H918X9 Other specified hearing loss, unspecified ear: Secondary | ICD-10-CM

## 2023-02-16 DIAGNOSIS — H9312 Tinnitus, left ear: Secondary | ICD-10-CM

## 2023-02-16 DIAGNOSIS — H9042 Sensorineural hearing loss, unilateral, left ear, with unrestricted hearing on the contralateral side: Secondary | ICD-10-CM

## 2023-02-16 DIAGNOSIS — H918X3 Other specified hearing loss, bilateral: Secondary | ICD-10-CM

## 2023-02-16 NOTE — Progress Notes (Signed)
Dear Dr. Daphine Deutscher, Here is my assessment for our mutual patient, Elizabeth Nash. Thank you for allowing me the opportunity to care for your patient. Please do not hesitate to contact me should you have any other questions. Sincerely, Dr. Jovita Kussmaul  Otolaryngology Clinic Note Referring provider: Dr. Daphine Deutscher HPI:  Elizabeth Nash is a 67 y.o. female kindly referred by Dr. Daphine Deutscher for evaluation of left tinnitus.   Initial visit (02/2023): Patient reports: she has had long-standing left sided non-pulsatile tinnitus - 3-5 years. Stable, not worsening significantly. High pitched. Bothers her some. No antecedent event. Does have a lot of noise exposure at work - in [employer] - but generally wears some Advertising copywriter. Used to work at [employer] but no noise exposure then. Patient denies: ear pain, fullness, drainage Patient additionally denies: deep pain in ear canal, eustachian tube symptoms such as popping, crackling, sensitivity to pressure changes Patient also denies barotrauma, vestibular suppressant use, ototoxic medication use Prior ear surgery: no Generally ears not an issue including infections, no FHX HL.   She is on a lot of OTC supplements, does not have list with her.  H&N Surgery: no Personal or FHx of bleeding dz or anesthesia difficulty: no   AP/AC: no  PMHx: GAD,HTN, GERD  Tobacco: no. Lives in [place], [place]  Independent Review of Additional Tests or Records:  Mary-Margaret Marin referral ntoes (01/18/2023): Noted ringing in ears, slight dizziness, no ear pathology noted in objective section; Referred to ENT Lrelevant labs: TSH 05/2022: wnl, Vit D wnl 02/16/2023 Audiogram was independently reviewed and interpreted by me and it reveals Right ear: normal hearing thresholds; 92% word interpretation at 50dB; type A tympanogram Left ear: normal hearing until 3000 Hz, then mild-mod SNHL; 88% word interpretation at 50dB; type A tympanogram    SNHL= Sensorineural hearing  loss   PMH/Meds/All/SocHx/FamHx/ROS:   Past Medical History:  Diagnosis Date   Anxiety    Arthritis    Atypical nevus 01/18/2001   left hip post outer (moderate)   Atypical nevus 05/14/2003   right tricep (mild)   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 shin (well diff)     Past Surgical History:  Procedure Laterality Date   BREAST SURGERY Bilateral 02-21-2012   L-300cc,R-325cc   CESAREAN SECTION     INCISION AND DRAINAGE Right 01/16/2013   Procedure: INCISION AND DRAINAGE;  Surgeon: Tami Ribas, MD;  Location: [facility];  Service: Orthopedics;  Laterality: Right;   TUBAL LIGATION      Family History  Problem Relation Age of Onset   Hypertension Mother    Hypothyroidism Mother    Cancer Father        ESOPHAGEAL AND BRAIN     Social Connections: Unknown (07/15/2021)   Received from [facility]   Social Network    Social Network: Not on file      Current Outpatient Medications:    ALPRAZolam (XANAX) 0.5 MG tablet, Take 1 tablet (0.5 mg total) by mouth 2 (two) times daily as needed for anxiety., Disp: 60 tablet, Rfl: 5   BIOTIN PO, Take by mouth., Disp: , Rfl:    Calcium Citrate (CITRACAL PO), Take by mouth., Disp: , Rfl:    cetirizine (ZYRTEC) 10 MG tablet, Take 10 mg by mouth daily., Disp: , Rfl:  Docusate Calcium (STOOL SOFTENER PO), Take by mouth. Reported on 03/05/2015, Disp: , Rfl:    hydrocortisone (ANUSOL-HC) 25 MG suppository, Place 1 suppository (25 mg total) rectally 2 (two) times daily., Disp: 12 suppository, Rfl: 0   lisinopril (ZESTRIL) 20 MG tablet, Take 1 tablet (20 mg total) by mouth daily., Disp: 90 tablet, Rfl: 1   nitroGLYCERIN (NITROSTAT) 0.4 MG SL tablet, Place 1 tablet (0.4 mg total) under the tongue every 5 (five) minutes as needed for chest pain.,  Disp: 50 tablet, Rfl: 3   POTASSIUM CHLORIDE PO, Take by mouth., Disp: , Rfl:    vitamin C (ASCORBIC ACID) 500 MG tablet, Take 500 mg by mouth daily., Disp: , Rfl:    Physical Exam:   Ht 5\' 7"  (1.702 m)   Wt 142 lb (64.4 kg)   BMI 22.24 kg/m   Salient findings:  CN II-XII intact  Bilateral EAC clear and TM intact with well pneumatized middle ear spaces Weber 512: LEFT Rinne 512: AC > BC b/l  Anterior rhinoscopy: Septum intact; no purulence noted No lesions of oral cavity/oropharynx No obviously palpable neck masses/lymphadenopathy/thyromegaly No respiratory distress or stridor  Seprately Identifiable Procedures:  None  Impression & Plans:  [name] is a 67 y.o. female with:  1. Asymmetrical hearing loss   2. Left-sided tinnitus    We discussed her audio, which does show asymmetric HL; this may account for her tinnitus; supplements could also play a role.  Discussed w/u for it, including MRI IAC to rule out retrocochlear lesions; will order it, she will check with insurance re: cost before proceeding Also discussed white noise, flavonoids, and masking in general with HA She wished to try flavonoids Offered rpt HT and f/u, but she wished for PRN; she will let us know if she wishes to schedule and proceed with MRI or change her mind, at which point will schedule f/u   See below regarding exact medications prescribed this encounter including dosages and route: No orders of the defined types were placed in this encounter.     Thank you for allowing me the opportunity to care for your patient. Please do not hesitate to contact me should you have any other questions.  Sincerely, Jovita Kussmaul, MD Otolarynoglogist (ENT), [facility] Phone: [phone] Fax: [phone]  02/25/2023, 11:41 AM   MDM:  Level 4 Complexity/Problems addressed: mod - new problem, unknown prognosis needs further testing; otherwise chronic stable problem Data complexity: mod -  independent review of notes, labs, and ordering imaging - Morbidity: low/mod  - Prescription Drug prescribed or managed: no

## 2023-02-16 NOTE — Progress Notes (Signed)
  [address] [place], [place] [address] [phone]  Audiological Evaluation    Name: Elizabeth Nash     DOB:   11-28-1955      MRN:   [id number]                                                                                     Service Date: 02/16/2023     Accompanied by: unaccompanied    Patient comes today after Dr. Allena Katz, ENT sent a referral for a hearing evaluation due to concerns with tinnitus.   Symptoms Yes Details  Hearing loss  []  No hearing loss perceived  Tinnitus  [x]  Left ear tinnitus , longstanding, does not keep her up at night  Ear pain/ Ear infections  []    Balance problems  []    Noise exposure  [x]  Reports using hearing protection while working in loud environments.  Previous ear surgeries  []    Family history  []    Amplification  []    Other  []      Otoscopy: Right ear: Clear external ear canals and notable landmarks visualized on the tympanic membrane. Left ear:  Clear external ear canals and notable landmarks visualized on the tympanic membrane.  Tympanometry: Right ear: Type A- Normal external ear canal volume with normal middle ear pressure and tympanic membrane compliance Left ear: Type A- Normal external ear canal volume with normal middle ear pressure and tympanic membrane compliance     Pure tone Audiometry: Right ear-  Normal hearing from [phone] Hz.  Left ear-  Normal hearing from [id number] Hz, then mild to moderate sensorineural hearing loss from 4000 Hz - 8000 Hz.  The hearing test results were completed under headphones and re-checked with inserts and results are deemed to be of good reliability. Test technique:  conventional     Speech Audiometry: Right ear- Speech Reception Threshold (SRT) was obtained at 15 dBHL Left ear-Speech Reception Threshold (SRT) was obtained at 15 dBHL   Word Recognition Score Tested using NU-6 (MLV) Right ear: 92% was obtained at a presentation level of 50 dBHL with contralateral masking which  is deemed as  excellent Left ear: 88% was obtained at a presentation level of 50 dBHL with contralateral masking which is deemed as  good     Impression: There is a significant difference in pure-tone thresholds between ears. There is not a significant difference in the word recognition score in between ears.    Recommendations: Follow up with ENT as scheduled for today. Return for a hearing evaluation if concerns with hearing changes arise or per MD recommendation. Use hearing protection when exposed to loud/damaging sounds. Consider various tinnitus strategies, including the use of a sound generator, hearing aids, and/or tinnitus retraining therapy.    [name] [name], AUD

## 2023-02-16 NOTE — Patient Instructions (Signed)
I have ordered an imaging study for you to complete prior to your next visit. Please call [facility] Scheduling at [phone] to schedule your imaging if you have not received a call within 24 hours. If you are unable to complete your imaging study prior to your next scheduled visit please call our office to let us know.

## 2023-04-02 ENCOUNTER — Telehealth: Payer: Self-pay | Admitting: Family Medicine

## 2023-04-02 NOTE — Telephone Encounter (Signed)
Copied from CRM [id number]. Topic: General - Other >> Apr 02, 2023  8:44 AM Fonda Kinder J wrote: Reason for CRM: PT states she is due for a bone density test and a mammogram and she would like to receive a call to schedule them

## 2023-04-03 ENCOUNTER — Telehealth: Payer: Self-pay

## 2023-04-03 NOTE — Telephone Encounter (Signed)
Copied from CRM [id number]. Topic: Clinical - Request for Lab/Test Order >> Apr 03, 2023  9:10 AM Geroge Baseman wrote: Reason for CRM: Patient is wanting to get her mammogram an bone density scheduled on the same day. She needs an order put in for a DEXA scan so she can schedule. Please call to advise.

## 2023-04-03 NOTE — Telephone Encounter (Signed)
Can you schedule her dexa? Not sure that she can do mammogram same day

## 2023-04-04 ENCOUNTER — Other Ambulatory Visit (HOSPITAL_COMMUNITY): Payer: Self-pay | Admitting: Nurse Practitioner

## 2023-04-04 DIAGNOSIS — Z1231 Encounter for screening mammogram for malignant neoplasm of breast: Secondary | ICD-10-CM

## 2023-04-04 NOTE — Telephone Encounter (Signed)
DXA scheduled - patient will call [facility] to make appt for mammogram

## 2023-04-11 ENCOUNTER — Ambulatory Visit (HOSPITAL_COMMUNITY)
Admission: RE | Admit: 2023-04-11 | Discharge: 2023-04-11 | Disposition: A | Discharge status: Home / Self Care | Payer: No Typology Code available for payment source | Source: Ambulatory Visit | Attending: Nurse Practitioner | Admitting: Nurse Practitioner

## 2023-04-11 DIAGNOSIS — Z1231 Encounter for screening mammogram for malignant neoplasm of breast: Secondary | ICD-10-CM | POA: Insufficient documentation

## 2023-04-13 ENCOUNTER — Other Ambulatory Visit: Payer: Self-pay | Admitting: Nurse Practitioner

## 2023-04-13 ENCOUNTER — Encounter: Payer: Self-pay | Admitting: Nurse Practitioner

## 2023-04-13 ENCOUNTER — Other Ambulatory Visit: Payer: No Typology Code available for payment source

## 2023-04-13 ENCOUNTER — Ambulatory Visit (INDEPENDENT_AMBULATORY_CARE_PROVIDER_SITE_OTHER): Payer: No Typology Code available for payment source | Admitting: Nurse Practitioner

## 2023-04-13 VITALS — BP 110/65 | HR 60 | Temp 99.4°F | Ht 67.0 in | Wt 143.0 lb

## 2023-04-13 DIAGNOSIS — Z0000 Encounter for general adult medical examination without abnormal findings: Secondary | ICD-10-CM

## 2023-04-13 DIAGNOSIS — Z780 Asymptomatic menopausal state: Secondary | ICD-10-CM

## 2023-04-13 NOTE — Progress Notes (Signed)
 Subjective:    Elizabeth Nash is a 68 y.o. female who presents for a Welcome to Medicare exam.   Cardiac Risk Factors include: advanced age (>29men, >78 women);smoking/ tobacco exposure      Objective:    Today's Vitals   04/13/23 0911  Weight: 143 lb (64.9 kg)  Height: 5' 7 (1.702 m)  Body mass index is 22.4 kg/m.  Medications Outpatient Encounter Medications as of 04/13/2023  Medication Sig   ALPRAZolam  (XANAX ) 0.5 MG tablet Take 1 tablet (0.5 mg total) by mouth 2 (two) times daily as needed for anxiety.   BIOTIN PO Take by mouth.   Calcium Citrate (CITRACAL PO) Take by mouth.   cetirizine  (ZYRTEC ) 10 MG tablet Take 10 mg by mouth daily.   Docusate Calcium (STOOL SOFTENER PO) Take by mouth. Reported on 03/05/2015   hydrocortisone  (ANUSOL -HC) 25 MG suppository Place 1 suppository (25 mg total) rectally 2 (two) times daily.   lisinopril  (ZESTRIL ) 20 MG tablet Take 1 tablet (20 mg total) by mouth daily.   nitroGLYCERIN  (NITROSTAT ) 0.4 MG SL tablet Place 1 tablet (0.4 mg total) under the tongue every 5 (five) minutes as needed for chest pain.   POTASSIUM CHLORIDE PO Take by mouth.   vitamin C (ASCORBIC ACID) 500 MG tablet Take 500 mg by mouth daily.   No facility-administered encounter medications on file as of 04/13/2023.     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 shin (well diff)   Past Surgical History:  Procedure Laterality Date   BREAST SURGERY Bilateral 02-21-2012   L-300cc,R-325cc   CESAREAN SECTION     INCISION AND DRAINAGE Right 01/16/2013   Procedure: INCISION AND DRAINAGE;  Surgeon: Franky JONELLE Curia, MD;  Location: [facility];  Service: Orthopedics;  Laterality: Right;   TUBAL LIGATION      Family History  Problem Relation Age of Onset   Hypertension Mother    Hypothyroidism Mother    Cancer Father        ESOPHAGEAL AND BRAIN   Social History   Occupational History   Occupation: Retired  Tobacco Use   Smoking status: Former    Current packs/day: 0.00    Types: Cigarettes    Start date: 12/19/1979    Quit date: 12/19/1987    Years since quitting: 35.3   Smokeless tobacco: Never  Substance and Sexual Activity   Alcohol use: Yes    Comment: rare   Drug use: No   Sexual activity: Not on file    Tobacco Counseling Counseling given: Not Answered   Immunizations and Health Maintenance Immunization History  Administered Date(s) Administered   Influenza Inj Mdck Quad Pf 12/05/2016, 01/19/2019   Influenza,inj,Quad PF,6+ Mos 12/26/2012, 11/17/2021   Influenza-Unspecified 12/23/2013, 12/05/2014   Moderna Sars-Covid-2 Vaccination 05/30/2019, 07/01/2019, 02/22/2020   Td 11/11/2003   Tdap 03/06/2005, 04/28/2019   Zoster, Live 10/31/2012   Health Maintenance Due  Topic Date Due   HIV Screening  Never done    Activities of Daily Living    04/13/2023    9:13 AM  In your  present state of health, do you have any difficulty performing the following activities:  Hearing? 0  Vision? 0  Difficulty concentrating or making decisions? 0  Walking or climbing stairs? 0  Dressing or bathing? 0  Doing errands, shopping? 0  Preparing Food and eating ? N  Using the Toilet? N  In the past six months, have you accidently leaked urine? N  Do you have problems with loss of bowel control? N  Managing your Medications? N  Managing your Finances? N  Housekeeping or managing your Housekeeping? N    Physical Exam   Physical Exam (optional), or other factors deemed appropriate based on the beneficiary's medical and social history and current clinical standards.   Advanced Directives: Does  Patient Have a Medical Advance Directive?: No Would patient like information on creating a medical advance directive?: No - Patient declined  EKG:  normal EKG, normal sinus rhythm, unchanged from previous tracings      Assessment:    This is a routine wellness examination for this patient . Welcome to medicare    Goals      DIET - EAT MORE FRUITS AND VEGETABLES     Exercise 150 min/wk Moderate Activity        Depression Screen    04/13/2023    9:21 AM 01/18/2023    8:38 AM 11/23/2022    9:49 AM 05/23/2022    9:17 AM  PHQ 2/9 Scores  PHQ - 2 Score 0 0 0 0  PHQ- 9 Score  1 0 0     Fall Risk    04/13/2023    9:21 AM  Fall Risk   Falls in the past year? 0    Cognitive Function:        04/13/2023    9:19 AM  6CIT Screen  What Year? 0 points  What month? 0 points  What time? 0 points  Count back from 20 0 points  Months in reverse 0 points  Repeat phrase 0 points  Total Score 0 points    Patient Care Team: Gladis Mustard, FNP as PCP - General (Nurse Practitioner)     Plan:   Welcome to medicare  I have personally reviewed and noted the following in the patient's chart:   Medical and social history Use of alcohol, tobacco or illicit drugs  Current medications and supplements Functional ability and status Nutritional status Physical activity Advanced directives List of other physicians Hospitalizations, surgeries, and ER visits in previous 12 months Vitals Screenings to include cognitive, depression, and falls Referrals and appointments  In addition, I have reviewed and discussed with patient certain preventive protocols, quality metrics, and best practice recommendations. A written personalized care plan for preventive services as well as general preventive health recommendations were provided to patient.     Mary-Margaret Gladis, [place] 04/13/2023

## 2023-05-18 ENCOUNTER — Encounter: Payer: Self-pay | Admitting: Nurse Practitioner

## 2023-05-22 ENCOUNTER — Ambulatory Visit: Payer: HMO | Admitting: Nurse Practitioner

## 2023-06-19 ENCOUNTER — Telehealth: Payer: Self-pay

## 2023-06-19 NOTE — Telephone Encounter (Signed)
 Copied from CRM [id number]. Topic: Appointments - Scheduling Inquiry for Clinic >> Jun 19, 2023  4:33 PM [place] J wrote: Reason for CRM: Patient would like to schedule a bone density appointment for after her upcoming appointment in May.

## 2023-06-20 NOTE — Telephone Encounter (Signed)
 Done

## 2023-07-03 ENCOUNTER — Other Ambulatory Visit: Payer: Self-pay | Admitting: *Deleted

## 2023-07-03 DIAGNOSIS — I10 Essential (primary) hypertension: Secondary | ICD-10-CM

## 2023-07-03 MED ORDER — LISINOPRIL 20 MG PO TABS: 20.0000 mg | ORAL_TABLET | Freq: Every day | ORAL | 0 refills | Status: DC

## 2023-07-13 ENCOUNTER — Other Ambulatory Visit: Payer: Self-pay | Admitting: Nurse Practitioner

## 2023-07-13 DIAGNOSIS — F411 Generalized anxiety disorder: Secondary | ICD-10-CM

## 2023-07-16 ENCOUNTER — Other Ambulatory Visit: Payer: Self-pay | Admitting: Nurse Practitioner

## 2023-07-16 DIAGNOSIS — F411 Generalized anxiety disorder: Secondary | ICD-10-CM

## 2023-07-17 ENCOUNTER — Other Ambulatory Visit: Payer: No Typology Code available for payment source

## 2023-07-17 ENCOUNTER — Ambulatory Visit: Payer: HMO | Admitting: Nurse Practitioner

## 2023-07-19 ENCOUNTER — Other Ambulatory Visit: Payer: Self-pay | Admitting: Nurse Practitioner

## 2023-07-19 DIAGNOSIS — F411 Generalized anxiety disorder: Secondary | ICD-10-CM

## 2023-07-19 NOTE — Telephone Encounter (Unsigned)
 Copied from CRM [id number]. Topic: Clinical - Medication Refill >> Jul 19, 2023  3:58 PM Yolanda T wrote: Medication:  ALPRAZolam  (XANAX ) 0.5 MG tablet   Has the patient contacted their pharmacy? No   This is the patient's preferred pharmacy:  [id number] - [place], [place] - [address] [place] [address] Phone: [phone] Fax: [phone]  Is this the correct pharmacy for this prescription? Yes  Has the prescription been filled recently? Yes  Is the patient out of the medication? No  Has the patient been seen for an appointment in the last year OR does the patient have an upcoming appointment? Yes  Can we respond through MyChart? Yes  Agent: Please be advised that Rx refills may take up to 3 business days. We ask that you follow-up with your pharmacy.  Patient is requesting enough medication to get her to her appt on 5/30

## 2023-07-19 NOTE — Telephone Encounter (Signed)
 Last Fill: 01/18/23  Last OV: 04/13/23 Next OV: 08/03/23  Routing to provider for review/authorization.

## 2023-07-25 DIAGNOSIS — L814 Other melanin hyperpigmentation: Secondary | ICD-10-CM | POA: Diagnosis not present

## 2023-07-25 DIAGNOSIS — L578 Other skin changes due to chronic exposure to nonionizing radiation: Secondary | ICD-10-CM | POA: Diagnosis not present

## 2023-07-25 DIAGNOSIS — L821 Other seborrheic keratosis: Secondary | ICD-10-CM | POA: Diagnosis not present

## 2023-07-25 DIAGNOSIS — L570 Actinic keratosis: Secondary | ICD-10-CM | POA: Diagnosis not present

## 2023-07-25 DIAGNOSIS — D225 Melanocytic nevi of trunk: Secondary | ICD-10-CM | POA: Diagnosis not present

## 2023-08-03 ENCOUNTER — Ambulatory Visit: Admitting: Nurse Practitioner

## 2023-08-03 ENCOUNTER — Other Ambulatory Visit: Payer: Self-pay | Admitting: Nurse Practitioner

## 2023-08-03 ENCOUNTER — Encounter: Payer: Self-pay | Admitting: Nurse Practitioner

## 2023-08-03 ENCOUNTER — Ambulatory Visit (INDEPENDENT_AMBULATORY_CARE_PROVIDER_SITE_OTHER)

## 2023-08-03 VITALS — BP 107/67 | HR 56 | Temp 97.3°F | Ht 67.0 in | Wt 143.0 lb

## 2023-08-03 DIAGNOSIS — K5904 Chronic idiopathic constipation: Secondary | ICD-10-CM

## 2023-08-03 DIAGNOSIS — F411 Generalized anxiety disorder: Secondary | ICD-10-CM | POA: Diagnosis not present

## 2023-08-03 DIAGNOSIS — J301 Allergic rhinitis due to pollen: Secondary | ICD-10-CM | POA: Diagnosis not present

## 2023-08-03 DIAGNOSIS — S161XXD Strain of muscle, fascia and tendon at neck level, subsequent encounter: Secondary | ICD-10-CM | POA: Diagnosis not present

## 2023-08-03 DIAGNOSIS — Z0000 Encounter for general adult medical examination without abnormal findings: Secondary | ICD-10-CM | POA: Diagnosis not present

## 2023-08-03 DIAGNOSIS — Z780 Asymptomatic menopausal state: Secondary | ICD-10-CM

## 2023-08-03 DIAGNOSIS — M8589 Other specified disorders of bone density and structure, multiple sites: Secondary | ICD-10-CM | POA: Diagnosis not present

## 2023-08-03 DIAGNOSIS — Z0001 Encounter for general adult medical examination with abnormal findings: Secondary | ICD-10-CM

## 2023-08-03 DIAGNOSIS — K640 First degree hemorrhoids: Secondary | ICD-10-CM | POA: Diagnosis not present

## 2023-08-03 DIAGNOSIS — K219 Gastro-esophageal reflux disease without esophagitis: Secondary | ICD-10-CM | POA: Diagnosis not present

## 2023-08-03 DIAGNOSIS — R6889 Other general symptoms and signs: Secondary | ICD-10-CM | POA: Diagnosis not present

## 2023-08-03 DIAGNOSIS — Z136 Encounter for screening for cardiovascular disorders: Secondary | ICD-10-CM | POA: Diagnosis not present

## 2023-08-03 LAB — LIPID PANEL

## 2023-08-03 MED ORDER — ALPRAZOLAM 0.5 MG PO TABS
0.5000 mg | ORAL_TABLET | Freq: Two times a day (BID) | ORAL | 5 refills | Status: DC | PRN
Start: 2023-08-03 — End: 2024-01-28

## 2023-08-03 MED ORDER — OMEPRAZOLE 20 MG PO CPDR: 20.0000 mg | DELAYED_RELEASE_CAPSULE | Freq: Every day | ORAL | 1 refills | Status: DC

## 2023-08-03 MED ORDER — CETIRIZINE HCL 10 MG PO TABS: 10.0000 mg | ORAL_TABLET | Freq: Every day | ORAL | 1 refills | Status: AC

## 2023-08-03 MED ORDER — CYCLOBENZAPRINE HCL 10 MG PO TABS: 10.0000 mg | ORAL_TABLET | Freq: Three times a day (TID) | ORAL | 0 refills | Status: AC | PRN

## 2023-08-03 NOTE — Addendum Note (Signed)
 Addended by: Brittnay Pigman, MARY-MARGARET on: 08/03/2023 02:47 PM   Modules accepted: Orders

## 2023-08-03 NOTE — Patient Instructions (Signed)
 Exercising to Stay Healthy To become healthy and stay healthy, it is recommended that you do moderate-intensity and vigorous-intensity exercise. You can tell that you are exercising at a moderate intensity if your heart starts beating faster and you start breathing faster but can still hold a conversation. You can tell that you are exercising at a vigorous intensity if you are breathing much harder and faster and cannot hold a conversation while exercising. How can exercise benefit me? Exercising regularly is important. It has many health benefits, such as: Improving overall fitness, flexibility, and endurance. Increasing bone density. Helping with weight control. Decreasing body fat. Increasing muscle strength and endurance. Reducing stress and tension, anxiety, depression, or anger. Improving overall health. What guidelines should I follow while exercising? Before you start a new exercise program, talk with your health care provider. Do not exercise so much that you hurt yourself, feel dizzy, or get very short of breath. Wear comfortable clothes and wear shoes with good support. Drink plenty of water while you exercise to prevent dehydration or heat stroke. Work out until your breathing and your heartbeat get faster (moderate intensity). How often should I exercise? Choose an activity that you enjoy, and set realistic goals. Your health care provider can help you make an activity plan that is individually designed and works best for you. Exercise regularly as told by your health care provider. This may include: Doing strength training two times a week, such as: Lifting weights. Using resistance bands. Push-ups. Sit-ups. Yoga. Doing a certain intensity of exercise for a given amount of time. Choose from these options: A total of 150 minutes of moderate-intensity exercise every week. A total of 75 minutes of vigorous-intensity exercise every week. A mix of moderate-intensity and  vigorous-intensity exercise every week. Children, pregnant women, people who have not exercised regularly, people who are overweight, and older adults may need to talk with a health care provider about what activities are safe to perform. If you have a medical condition, be sure to talk with your health care provider before you start a new exercise program. What are some exercise ideas? Moderate-intensity exercise ideas include: Walking 1 mile (1.6 km) in about 15 minutes. Biking. Hiking. Golfing. Dancing. Water aerobics. Vigorous-intensity exercise ideas include: Walking 4.5 miles (7.2 km) or more in about 1 hour. Jogging or running 5 miles (8 km) in about 1 hour. Biking 10 miles (16.1 km) or more in about 1 hour. Lap swimming. Roller-skating or in-line skating. Cross-country skiing. Vigorous competitive sports, such as football, basketball, and soccer. Jumping rope. Aerobic dancing. What are some everyday activities that can help me get exercise? Yard work, such as: Child psychotherapist. Raking and bagging leaves. Washing your car. Pushing a stroller. Shoveling snow. Gardening. Washing windows or floors. How can I be more active in my day-to-day activities? Use stairs instead of an elevator. Take a walk during your lunch break. If you drive, park your car farther away from your work or school. If you take public transportation, get off one stop early and walk the rest of the way. Stand up or walk around during all of your indoor phone calls. Get up, stretch, and walk around every 30 minutes throughout the day. Enjoy exercise with a friend. Support to continue exercising will help you keep a regular routine of activity. Where to find more information You can find more information about exercising to stay healthy from: U.S. Department of Health and Human Services: ThisPath.fi Centers for Disease Control and Prevention (  CDC): FootballExhibition.com.br Summary Exercising regularly is  important. It will improve your overall fitness, flexibility, and endurance. Regular exercise will also improve your overall health. It can help you control your weight, reduce stress, and improve your bone density. Do not exercise so much that you hurt yourself, feel dizzy, or get very short of breath. Before you start a new exercise program, talk with your health care provider. This information is not intended to replace advice given to you by your health care provider. Make sure you discuss any questions you have with your health care provider. Document Revised: 06/18/2020 Document Reviewed: 06/18/2020 Elsevier Patient Education  2024 [employer].

## 2023-08-03 NOTE — Progress Notes (Signed)
 Subjective:    Patient ID: Elizabeth Nash, female    DOB: December 06, 1955, 68 y.o.   MRN: [id number]   Chief Complaint: annual physical  HPI:  Elizabeth Nash is a 68 y.o. who identifies as a female who was assigned female at birth.   Social history: Lives with: daughter an 2 kids live with her Work history: retired   Water engineer in today for follow up of the following chronic medical issues:  1. Annual physical exam No PAP  2. GAD (generalized anxiety disorder) Takes xanax  mainly at night.    01/18/2023    8:39 AM 11/23/2022    9:49 AM 05/23/2022    9:17 AM 03/03/2022    9:49 AM  GAD 7 : Generalized Anxiety Score  Nervous, Anxious, on Edge 1 0 0 0  Control/stop worrying 0 0 0 0  Worry too much - different things 0 0 0 0  Trouble relaxing 0 0 0 0  Restless 0 0 0 0  Easily annoyed or irritable 0 0 0 0  Afraid - awful might happen 0 0 0 0  Total GAD 7 Score 1 0 0 0  Anxiety Difficulty Not difficult at all Not difficult at all Not difficult at all Not difficult at all        3. Grade I hemorrhoids Doing ok right now  4. Chronic idiopathic constipation Some better. Has not need to taken any linzess . Goes almost daily. Takes magnesium supplement and that seems to help. Says she has been going daily.   New complaints: - GERD-has been taking omeprazole but has been buying over the counter. Would like prescription. - allergic rhinitis-takes zytrec OTC but would be cheaper with prescription - gets occasional crick in neck ad needs muscle relaxer- currently not bothering her.  Allergies  Allergen Reactions   Codeine    Penicillins    Sulfa Antibiotics    Outpatient Encounter Medications as of 08/03/2023  Medication Sig   ALPRAZolam  (XANAX ) 0.5 MG tablet Take 1 tablet (0.5 mg total) by mouth 2 (two) times daily as needed for anxiety.   BIOTIN PO Take by mouth.   Calcium Citrate (CITRACAL PO) Take by mouth.   cetirizine (ZYRTEC) 10 MG tablet Take 10 mg by mouth daily.   Docusate  Calcium (STOOL SOFTENER PO) Take by mouth. Reported on 03/05/2015   hydrocortisone  (ANUSOL -HC) 25 MG suppository Place 1 suppository (25 mg total) rectally 2 (two) times daily.   lisinopril  (ZESTRIL ) 20 MG tablet Take 1 tablet (20 mg total) by mouth daily.   nitroGLYCERIN  (NITROSTAT ) 0.4 MG SL tablet Place 1 tablet (0.4 mg total) under the tongue every 5 (five) minutes as needed for chest pain.   POTASSIUM CHLORIDE PO Take by mouth.   vitamin C (ASCORBIC ACID) 500 MG tablet Take 500 mg by mouth daily.   No facility-administered encounter medications on file as of 08/03/2023.    Past Surgical History:  Procedure Laterality Date   BREAST SURGERY Bilateral 02-21-2012   L-300cc,R-325cc   CESAREAN SECTION     INCISION AND DRAINAGE Right 01/16/2013   Procedure: INCISION AND DRAINAGE;  Surgeon: Milagros Alf, MD;  Location: [facility];  Service: Orthopedics;  Laterality: Right;   TUBAL LIGATION      Family History  Problem Relation Age of Onset   Hypertension Mother    Hypothyroidism Mother    Cancer Father        ESOPHAGEAL AND BRAIN      Controlled substance contract:  n/a     Review of Systems  Constitutional:  Negative for diaphoresis.  Eyes:  Negative for pain.  Respiratory:  Negative for shortness of breath.   Cardiovascular:  Negative for chest pain, palpitations and leg swelling.  Gastrointestinal:  Negative for abdominal pain.  Endocrine: Negative for polydipsia.  Skin:  Negative for rash.  Neurological:  Negative for dizziness, weakness and headaches.  Hematological:  Does not bruise/bleed easily.  All other systems reviewed and are negative.      Objective:   Physical Exam Vitals and nursing note reviewed.  Constitutional:      General: She is not in acute distress.    Appearance: Normal appearance. She is well-developed.  HENT:     Head: Normocephalic.     Right Ear: Tympanic membrane normal.     Left Ear: Tympanic membrane normal.      Nose: Nose normal.     Mouth/Throat:     Mouth: Mucous membranes are moist.  Eyes:     Pupils: Pupils are equal, round, and reactive to light.  Neck:     Vascular: No carotid bruit or JVD.  Cardiovascular:     Rate and Rhythm: Normal rate and regular rhythm.     Heart sounds: Normal heart sounds.  Pulmonary:     Effort: Pulmonary effort is normal. No respiratory distress.     Breath sounds: Normal breath sounds. No wheezing or rales.  Chest:     Chest wall: No tenderness.  Abdominal:     General: Bowel sounds are normal. There is no distension or abdominal bruit.     Palpations: Abdomen is soft. There is no hepatomegaly, splenomegaly, mass or pulsatile mass.     Tenderness: There is no abdominal tenderness.  Musculoskeletal:        General: Normal range of motion.     Cervical back: Normal range of motion and neck supple.  Lymphadenopathy:     Cervical: No cervical adenopathy.  Skin:    General: Skin is warm and dry.  Neurological:     Mental Status: She is alert and oriented to person, place, and time.     Deep Tendon Reflexes: Reflexes are normal and symmetric.  Psychiatric:        Behavior: Behavior normal.        Thought Content: Thought content normal.        Judgment: Judgment normal.    BP 107/67   Pulse (!) 56   Temp (!) 97.3 F (36.3 C) (Temporal)   Ht 5\' 7"  (1.702 m)   Wt 143 lb (64.9 kg)   SpO2 100%   BMI 22.40 kg/m        Assessment & Plan:  Elizabeth Nash comes in today with chief complaint of Medical Management of Chronic Issues   Diagnosis and orders addressed:  1. Annual physical exam (Primary) - CBC with Differential/Platelet - CMP14+EGFR - Lipid panel - Thyroid  Panel With TSH - VITAMIN D  25 Hydroxy (Vit-D Deficiency, Fractures)  2. GAD (generalized anxiety disorder) Stress management  3. Chronic idiopathic constipation Continue current routine of OTC supplements  4. Grade I hemorrhoids  5. Gastroesophageal reflux disease without  esophagitis Avoid spicy foods Do not eat 2 hours prior to bedtime - omeprazole  (PRILOSEC) 20 MG capsule; Take 1 capsule (20 mg total) by mouth daily.  Dispense: 90 capsule; Refill: 1  6. Seasonal allergic rhinitis due to pollen - cetirizine  (ZYRTEC ) 10 MG tablet; Take 1 tablet (10 mg total) by mouth daily.  Dispense: 90 tablet; Refill: 1  7. Strain of neck muscle, subsequent encounter Moist heat when occurs - cyclobenzaprine  (FLEXERIL ) 10 MG tablet; Take 1 tablet (10 mg total) by mouth 3 (three) times daily as needed for muscle spasms.  Dispense: 30 tablet; Refill: 0   Labs pending Health Maintenance reviewed Diet and exercise encouraged  Follow up plan: 6 month   Elizabeth Gaylyn Keas, FNP

## 2023-08-04 LAB — CMP14+EGFR
ALT: 21 IU/L (ref 0–32)
AST: 18 IU/L (ref 0–40)
Albumin: 4.3 g/dL (ref 3.9–4.9)
Alkaline Phosphatase: 73 IU/L (ref 44–121)
BUN/Creatinine Ratio: 16 (ref 12–28)
BUN: 13 mg/dL (ref 8–27)
Bilirubin Total: 0.4 mg/dL (ref 0.0–1.2)
CO2: 22 mmol/L (ref 20–29)
Calcium: 9.1 mg/dL (ref 8.7–10.3)
Chloride: 104 mmol/L (ref 96–106)
Creatinine, Ser: 0.8 mg/dL (ref 0.57–1.00)
Globulin, Total: 2.4 g/dL (ref 1.5–4.5)
Glucose: 89 mg/dL (ref 70–99)
Potassium: 5 mmol/L (ref 3.5–5.2)
Sodium: 139 mmol/L (ref 134–144)
Total Protein: 6.7 g/dL (ref 6.0–8.5)
eGFR: 82 mL/min/{1.73_m2} (ref >59)

## 2023-08-04 LAB — CBC WITH DIFFERENTIAL/PLATELET
Basophils Absolute: 0.1 10*3/uL (ref 0.0–0.2)
Basos: 1 %
EOS (ABSOLUTE): 0.2 10*3/uL (ref 0.0–0.4)
Eos: 4 %
Hematocrit: 39.2 % (ref 34.0–46.6)
Hemoglobin: 13.2 g/dL (ref 11.1–15.9)
Immature Grans (Abs): 0 10*3/uL (ref 0.0–0.1)
Immature Granulocytes: 0 %
Lymphocytes Absolute: 2.1 10*3/uL (ref 0.7–3.1)
Lymphs: 41 %
MCH: 32 pg (ref 26.6–33.0)
MCHC: 33.7 g/dL (ref 31.5–35.7)
MCV: 95 fL (ref 79–97)
Monocytes Absolute: 0.5 10*3/uL (ref 0.1–0.9)
Monocytes: 10 %
Neutrophils Absolute: 2.2 10*3/uL (ref 1.4–7.0)
Neutrophils: 44 %
Platelets: 300 10*3/uL (ref 150–450)
RBC: 4.12 x10E6/uL (ref 3.77–5.28)
RDW: 11.8 % (ref 11.7–15.4)
WBC: 5 10*3/uL (ref 3.4–10.8)

## 2023-08-04 LAB — LIPID PANEL
Chol/HDL Ratio: 2.3 ratio (ref 0.0–4.4)
Cholesterol, Total: 191 mg/dL (ref 100–199)
HDL: 84 mg/dL (ref >39)
LDL Chol Calc (NIH): 94 mg/dL (ref 0–99)
Triglycerides: 70 mg/dL (ref 0–149)
VLDL Cholesterol Cal: 13 mg/dL (ref 5–40)

## 2023-08-04 LAB — THYROID PANEL WITH TSH
Free Thyroxine Index: 1.6 (ref 1.2–4.9)
T3 Uptake Ratio: 26 % (ref 24–39)
T4, Total: 6.2 ug/dL (ref 4.5–12.0)
TSH: 2.33 u[IU]/mL (ref 0.450–4.500)

## 2023-08-04 LAB — VITAMIN D 25 HYDROXY (VIT D DEFICIENCY, FRACTURES): Vit D, 25-Hydroxy: 50.1 ng/mL (ref 30.0–100.0)

## 2023-08-06 ENCOUNTER — Ambulatory Visit: Payer: Self-pay | Admitting: Nurse Practitioner

## 2023-08-16 DIAGNOSIS — F411 Generalized anxiety disorder: Secondary | ICD-10-CM | POA: Diagnosis not present

## 2023-08-16 DIAGNOSIS — Z008 Encounter for other general examination: Secondary | ICD-10-CM | POA: Diagnosis not present

## 2023-08-16 DIAGNOSIS — I10 Essential (primary) hypertension: Secondary | ICD-10-CM | POA: Diagnosis not present

## 2023-09-24 ENCOUNTER — Telehealth: Payer: Self-pay | Admitting: Nurse Practitioner

## 2023-09-24 NOTE — Telephone Encounter (Signed)
 Copied from CRM [id number]. Topic: Clinical - Medical Advice >> Sep 24, 2023  8:45 AM Cherylann RAMAN wrote: Reason for CRM: Patient called with complaints of stool being black and experiencing diarrhea. Patient states that she believes that it stems from drinking the mushroom coffee. Patient is requesting a callback from provider to inform of next steps or a possible medication.

## 2023-09-24 NOTE — Telephone Encounter (Signed)
 Pt has appt tomorrow at 9:20 with DOD for evaluation of this.

## 2023-09-25 ENCOUNTER — Encounter (INDEPENDENT_AMBULATORY_CARE_PROVIDER_SITE_OTHER): Payer: Self-pay | Admitting: *Deleted

## 2023-09-25 ENCOUNTER — Other Ambulatory Visit: Payer: Self-pay

## 2023-09-25 ENCOUNTER — Encounter: Payer: Self-pay | Admitting: Family Medicine

## 2023-09-25 ENCOUNTER — Ambulatory Visit (INDEPENDENT_AMBULATORY_CARE_PROVIDER_SITE_OTHER)

## 2023-09-25 VITALS — BP 122/75 | HR 68 | Temp 97.3°F | Ht 67.0 in | Wt 144.8 lb

## 2023-09-25 DIAGNOSIS — R140 Abdominal distension (gaseous): Secondary | ICD-10-CM

## 2023-09-25 DIAGNOSIS — R195 Other fecal abnormalities: Secondary | ICD-10-CM | POA: Diagnosis not present

## 2023-09-25 LAB — CBC WITH DIFFERENTIAL/PLATELET
Basophils Absolute: 0.1 x10E3/uL (ref 0.0–0.2)
Basos: 1 %
EOS (ABSOLUTE): 0.3 x10E3/uL (ref 0.0–0.4)
Eos: 3 %
Hematocrit: 36.6 % (ref 34.0–46.6)
Hemoglobin: 11.7 g/dL (ref 11.1–15.9)
Immature Grans (Abs): 0 x10E3/uL (ref 0.0–0.1)
Immature Granulocytes: 0 %
Lymphocytes Absolute: 1.5 x10E3/uL (ref 0.7–3.1)
Lymphs: 14 %
MCH: 30.9 pg (ref 26.6–33.0)
MCHC: 32 g/dL (ref 31.5–35.7)
MCV: 97 fL (ref 79–97)
Monocytes Absolute: 0.7 x10E3/uL (ref 0.1–0.9)
Monocytes: 7 %
Neutrophils Absolute: 7.8 x10E3/uL — ABNORMAL HIGH (ref 1.4–7.0)
Neutrophils: 75 %
Platelets: 223 x10E3/uL (ref 150–450)
RBC: 3.79 x10E6/uL (ref 3.77–5.28)
RDW: 11.8 % (ref 11.7–15.4)
WBC: 10.3 x10E3/uL (ref 3.4–10.8)

## 2023-09-25 LAB — CMP14+EGFR
ALT: 13 IU/L (ref 0–32)
AST: 11 IU/L (ref 0–40)
Albumin: 4.1 g/dL (ref 3.9–4.9)
Alkaline Phosphatase: 70 IU/L (ref 44–121)
BUN/Creatinine Ratio: 28 (ref 12–28)
BUN: 19 mg/dL (ref 8–27)
Bilirubin Total: 0.4 mg/dL (ref 0.0–1.2)
CO2: 20 mmol/L (ref 20–29)
Calcium: 8.9 mg/dL (ref 8.7–10.3)
Chloride: 105 mmol/L (ref 96–106)
Creatinine, Ser: 0.68 mg/dL (ref 0.57–1.00)
Globulin, Total: 2.4 g/dL (ref 1.5–4.5)
Glucose: 97 mg/dL (ref 70–99)
Potassium: 4.2 mmol/L (ref 3.5–5.2)
Sodium: 141 mmol/L (ref 134–144)
Total Protein: 6.5 g/dL (ref 6.0–8.5)
eGFR: 97 mL/min/1.73 (ref >59)

## 2023-09-25 NOTE — Progress Notes (Signed)
 Subjective:  Patient ID: Elizabeth Nash, female    DOB: [birth year], 68 y.o.   MRN: [id number]  Patient Care Team: Gladis Mustard, FNP as PCP - General (Nurse Practitioner)   Chief Complaint:  dark stool (Black stool X 2 days )   HPI: Elizabeth Nash is a 68 y.o. female presenting on 09/25/2023 for dark stool (Black stool X 2 days )   Elizabeth Nash is a 68 year old female who presents with gastrointestinal symptoms including diarrhea and dark stools.  She has a history of chronic constipation, managed with four stool softeners daily to maintain normal bowel movements. Last week, she began experiencing bloating, followed by diarrhea starting on Friday, with approximately six episodes that day.  Over the weekend, she noticed her stools were 'plum black' and experienced abdominal pain and gurgling noises. She also reported a significant decrease in appetite, eating only once a day, and has been trying to avoid meat, although she consumed ham biscuits the previous night.  She started taking a mushroom coffee six weeks ago but discontinued it last week. Additionally, she began a new beet supplement two weeks ago.  No fever, chills, chest pain, or shortness of breath. She experienced nausea on two nights, feeling as though she might vomit, but did not actually vomit. She describes her stools as 'dark, dark black' over the past two days.  She has not been to her previous GI doctor due to insurance changes and is seeking a new gastroenterologist within her network.       Relevant past medical, surgical, family, and social history reviewed and updated as indicated.  Allergies and medications reviewed and updated. Data reviewed: Chart in Epic.   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 shin (well diff)    Past Surgical History:  Procedure Laterality Date   BREAST SURGERY Bilateral 02-21-2012   L-300cc,R-325cc   CESAREAN SECTION     INCISION AND DRAINAGE Right 01/16/2013   Procedure: INCISION AND DRAINAGE;  Surgeon: Franky JONELLE Curia, MD;  Location: [facility];  Service: Orthopedics;  Laterality: Right;   TUBAL LIGATION      Social History   Socioeconomic History   Marital status: Divorced    Spouse name: Not on file   Number of children: 2   Years of education: Not on file   Highest education level: Associate degree: academic program  Occupational History   Occupation: Retired  Tobacco Use   Smoking status: Former    Current packs/day: 0.00    Types: Cigarettes    Start date: 12/19/1979    Quit date: 12/19/1987    Years since quitting: 35.7   Smokeless tobacco: Never  Substance and Sexual Activity   Alcohol use: Yes    Comment: rare   Drug use: No   Sexual activity: Not on file  Other Topics Concern   Not on file  Social History Narrative   Not on file   Social Drivers of Health   Financial Resource Strain: Medium Risk (07/30/2023)   Overall Financial Resource Strain (CARDIA)  Difficulty of Paying Living Expenses: Somewhat hard  Food Insecurity: No Food Insecurity (07/30/2023)   Hunger Vital Sign    Worried About Running Out of Food in the Last Year: Never true    Ran Out of Food in the Last Year: Never true  Transportation Needs: No Transportation Needs (07/30/2023)   PRAPARE - Administrator, Civil Service (Medical): No    Lack of Transportation (Non-Medical): No  Physical Activity: Insufficiently Active (07/30/2023)   Exercise Vital Sign    Days of Exercise per Week: 3 days    Minutes of Exercise per Session: 40 min  Stress: No Stress Concern Present (07/30/2023)   [employer] of  Occupational Health - Occupational Stress Questionnaire    Feeling of Stress : Not at all  Social Connections: Moderately Integrated (07/30/2023)   Social Connection and Isolation Panel    Frequency of Communication with Friends and Family: More than three times a week    Frequency of Social Gatherings with Friends and Family: More than three times a week    Attends Religious Services: More than 4 times per year    Active Member of [employer] or Organizations: Yes    Attends Engineer, structural: More than 4 times per year    Marital Status: Divorced  Intimate Partner Violence: Not At Risk (04/13/2023)   Humiliation, Afraid, Rape, and Kick questionnaire    Fear of Current or Ex-Partner: No    Emotionally Abused: No    Physically Abused: No    Sexually Abused: No    Outpatient Encounter Medications as of 09/25/2023  Medication Sig   ALPRAZolam  (XANAX ) 0.5 MG tablet Take 1 tablet (0.5 mg total) by mouth 2 (two) times daily as needed for anxiety.   BIOTIN PO Take by mouth.   Calcium Citrate (CITRACAL PO) Take by mouth.   cetirizine  (ZYRTEC ) 10 MG tablet Take 1 tablet (10 mg total) by mouth daily.   cyclobenzaprine  (FLEXERIL ) 10 MG tablet Take 1 tablet (10 mg total) by mouth 3 (three) times daily as needed for muscle spasms.   Docusate Calcium (STOOL SOFTENER PO) Take by mouth. Reported on 03/05/2015   hydrocortisone  (ANUSOL -HC) 25 MG suppository Place 1 suppository (25 mg total) rectally 2 (two) times daily.   linaclotide  (LINZESS ) 145 MCG CAPS capsule Take 145 mcg by mouth daily before breakfast.   lisinopril  (ZESTRIL ) 20 MG tablet Take 1 tablet (20 mg total) by mouth daily.   Misc Natural Products (BEET ROOT) 500 MG CAPS Take by mouth.   nitroGLYCERIN  (NITROSTAT ) 0.4 MG SL tablet Place 1 tablet (0.4 mg total) under the tongue every 5 (five) minutes as needed for chest pain.   omeprazole  (PRILOSEC) 20 MG capsule Take 20 mg by mouth daily.   omeprazole  (PRILOSEC) 20 MG capsule Take 1  capsule (20 mg total) by mouth daily.   POTASSIUM CHLORIDE PO Take by mouth.   vitamin C (ASCORBIC ACID) 500 MG tablet Take 500 mg by mouth daily.   No facility-administered encounter medications on file as of 09/25/2023.    Allergies  Allergen Reactions   Codeine    Penicillins    Sulfa Antibiotics     Pertinent ROS per HPI, otherwise unremarkable      Objective:  BP 122/75   Pulse 68   Temp (!) 97.3 F (36.3 C)   Ht 5' 7 (1.702 m)   Wt 144 lb 12.8 oz (65.7 kg)   SpO2 100%   BMI 22.68 kg/m  Wt Readings from Last 3 Encounters:  09/25/23 144 lb 12.8 oz (65.7 kg)  08/03/23 143 lb (64.9 kg)  04/13/23 143 lb (64.9 kg)    Physical Exam Vitals and nursing note reviewed.  Constitutional:      General: She is not in acute distress.    Appearance: Normal appearance. She is well-developed, well-groomed and normal weight. She is not ill-appearing, toxic-appearing or diaphoretic.  HENT:     Head: Normocephalic and atraumatic.     Jaw: There is normal jaw occlusion.     Right Ear: Hearing normal.     Left Ear: Hearing normal.     Nose: Nose normal.     Mouth/Throat:     Lips: Pink.     Mouth: Mucous membranes are moist.     Pharynx: Oropharynx is Nash. Uvula midline.  Eyes:     General: Lids are normal.     Extraocular Movements: Extraocular movements intact.     Conjunctiva/sclera: Conjunctivae normal.     Pupils: Pupils are equal, round, and reactive to light.  Neck:     Thyroid : No thyroid  mass, thyromegaly or thyroid  tenderness.     Vascular: No carotid bruit or JVD.     Trachea: Trachea and phonation normal.  Cardiovascular:     Rate and Rhythm: Normal rate and regular rhythm.     Chest Wall: PMI is not displaced.     Pulses: Normal pulses.     Heart sounds: Normal heart sounds. No murmur heard.    No friction rub. No gallop.  Pulmonary:     Effort: Pulmonary effort is normal. No respiratory distress.     Breath sounds: Normal breath sounds. No wheezing.   Abdominal:     General: Bowel sounds are normal. There is no distension or abdominal bruit.     Palpations: Abdomen is soft. There is no hepatomegaly, splenomegaly or mass.     Tenderness: There is abdominal tenderness (minimal, RLQ and suprapubic). There is no right CVA tenderness, left CVA tenderness, guarding or rebound.     Hernia: No hernia is present.  Musculoskeletal:        General: Normal range of motion.     Cervical back: Normal range of motion and neck supple.     Right lower leg: No edema.     Left lower leg: No edema.  Lymphadenopathy:     Cervical: No cervical adenopathy.  Skin:    General: Skin is warm and dry.     Capillary Refill: Capillary refill takes less than 2 seconds.     Coloration: Skin is not cyanotic, jaundiced or pale.     Findings: No rash.  Neurological:     General: No focal deficit present.     Mental Status: She is alert and oriented to person, place, and time.     Sensory: Sensation is intact.     Motor: Motor function is intact.     Coordination: Coordination is intact.     Gait: Gait is intact.     Deep Tendon Reflexes: Reflexes are normal and symmetric.  Psychiatric:        Attention and Perception: Attention and perception normal.        Mood and Affect: Mood and affect normal.        Speech: Speech normal.        Behavior: Behavior normal. Behavior is cooperative.        Thought Content: Thought content normal.  Cognition and Memory: Cognition and memory normal.        Judgment: Judgment normal.    Results for orders placed or performed in visit on 08/03/23  CBC with Differential/Platelet   Collection Time: 08/03/23 11:10 AM  Result Value Ref Range   WBC 5.0 3.4 - 10.8 x10E3/uL   RBC 4.12 3.77 - 5.28 x10E6/uL   Hemoglobin 13.2 11.1 - 15.9 g/dL   Hematocrit 60.7 65.9 - 46.6 %   MCV 95 79 - 97 fL   MCH 32.0 26.6 - 33.0 pg   MCHC 33.7 31.5 - 35.7 g/dL   RDW 88.1 88.2 - 84.5 %   Platelets 300 150 - 450 x10E3/uL   Neutrophils  44 Not Estab. %   Lymphs 41 Not Estab. %   Monocytes 10 Not Estab. %   Eos 4 Not Estab. %   Basos 1 Not Estab. %   Neutrophils Absolute 2.2 1.4 - 7.0 x10E3/uL   Lymphocytes Absolute 2.1 0.7 - 3.1 x10E3/uL   Monocytes Absolute 0.5 0.1 - 0.9 x10E3/uL   EOS (ABSOLUTE) 0.2 0.0 - 0.4 x10E3/uL   Basophils Absolute 0.1 0.0 - 0.2 x10E3/uL   Immature Granulocytes 0 Not Estab. %   Immature Grans (Abs) 0.0 0.0 - 0.1 x10E3/uL  CMP14+EGFR   Collection Time: 08/03/23 11:10 AM  Result Value Ref Range   Glucose 89 70 - 99 mg/dL   BUN 13 8 - 27 mg/dL   Creatinine, Ser 9.19 0.57 - 1.00 mg/dL   eGFR 82 >40 fO/fpw/8.26   BUN/Creatinine Ratio 16 12 - 28   Sodium 139 134 - 144 mmol/L   Potassium 5.0 3.5 - 5.2 mmol/L   Chloride 104 96 - 106 mmol/L   CO2 22 20 - 29 mmol/L   Calcium 9.1 8.7 - 10.3 mg/dL   Total Protein 6.7 6.0 - 8.5 g/dL   Albumin 4.3 3.9 - 4.9 g/dL   Globulin, Total 2.4 1.5 - 4.5 g/dL   Bilirubin Total 0.4 0.0 - 1.2 mg/dL   Alkaline Phosphatase 73 44 - 121 IU/L   AST 18 0 - 40 IU/L   ALT 21 0 - 32 IU/L  Lipid panel   Collection Time: 08/03/23 11:10 AM  Result Value Ref Range   Cholesterol, Total 191 100 - 199 mg/dL   Triglycerides 70 0 - 149 mg/dL   HDL 84 >60 mg/dL   VLDL Cholesterol Cal 13 5 - 40 mg/dL   LDL Chol Calc (NIH) 94 0 - 99 mg/dL   Chol/HDL Ratio 2.3 0.0 - 4.4 ratio  Thyroid  Panel With TSH   Collection Time: 08/03/23 11:10 AM  Result Value Ref Range   TSH 2.330 0.450 - 4.500 uIU/mL   T4, Total 6.2 4.5 - 12.0 ug/dL   T3 Uptake Ratio 26 24 - 39 %   Free Thyroxine Index 1.6 1.2 - 4.9  VITAMIN D  25 Hydroxy (Vit-D Deficiency, Fractures)   Collection Time: 08/03/23 11:10 AM  Result Value Ref Range   Vit D, 25-Hydroxy 50.1 30.0 - 100.0 ng/mL       Pertinent labs & imaging results that were available during my care of the patient were reviewed by me and considered in my medical decision making.  Assessment & Plan:  Eilyn was seen today for dark  stool.  Diagnoses and all orders for this visit:  Dark stools -     CMP14+EGFR -     CBC with Differential/Platelet -     Fecal occult blood, imunochemical; Future -  Ambulatory referral to Gastroenterology  Abdominal bloating -     CMP14+EGFR -     CBC with Differential/Platelet -     Fecal occult blood, imunochemical; Future -     Ambulatory referral to Gastroenterology      Gastrointestinal Bleeding Recent onset of diarrhea and melena, raising concern for gastrointestinal bleeding. Symptoms began with bloating, progressing to diarrhea and melena. Constipation managed with stool softeners. Differential includes potential upper GI bleed. Denies fever, chills, chest pain, or shortness of breath but reports nausea without vomiting. Abdominal tenderness present without guarding. Recently discontinued mushroom coffee and started a new supplement that may cause dark stools, but severity of symptoms warrants further investigation. - Order CBC and stool test for occult blood. - Refer to a GI specialist within her insurance network. - Advise her to contact the GI specialist directly to expedite the appointment. - Instruct her to call if symptoms worsen or if there is no contact from the GI specialist by the end of the week.  Constipation Chronic constipation managed with stool softeners. Recent diarrhea and melena are atypical for her usual pattern, suggesting an acute change in gastrointestinal status.          Continue all other maintenance medications.  Follow up plan: Return if symptoms worsen or fail to improve.   Continue healthy lifestyle choices, including diet (rich in fruits, vegetables, and lean proteins, and low in salt and simple carbohydrates) and exercise (at least 30 minutes of moderate physical activity daily).    The above assessment and management plan was discussed with the patient. The patient verbalized understanding of and has agreed to the management  plan. Patient is aware to call the clinic if they develop any new symptoms or if symptoms persist or worsen. Patient is aware when to return to the clinic for a follow-up visit. Patient educated on when it is appropriate to go to the emergency department.   Rosaline Bruns, FNP-C [facility] [phone]

## 2023-09-26 ENCOUNTER — Ambulatory Visit: Payer: Self-pay | Admitting: Family Medicine

## 2023-09-26 ENCOUNTER — Telehealth: Payer: Self-pay

## 2023-09-26 LAB — FECAL OCCULT BLOOD, IMMUNOCHEMICAL: Fecal Occult Bld: NEGATIVE

## 2023-09-26 NOTE — Telephone Encounter (Signed)
 Copied from CRM [id number]. Topic: Clinical - Lab/Test Results >> Sep 26, 2023  7:59 AM Emylou G wrote: Reason for CRM: Patient called.. would like call back on lab results for possible medication

## 2023-09-26 NOTE — Telephone Encounter (Signed)
 Lmtcb

## 2023-09-26 NOTE — Telephone Encounter (Signed)
 Reviewed results with patient per providers notes. Patient voiced understanding.

## 2023-10-03 ENCOUNTER — Ambulatory Visit (INDEPENDENT_AMBULATORY_CARE_PROVIDER_SITE_OTHER): Admitting: Gastroenterology

## 2023-10-03 ENCOUNTER — Encounter (INDEPENDENT_AMBULATORY_CARE_PROVIDER_SITE_OTHER): Payer: Self-pay | Admitting: Gastroenterology

## 2023-10-03 VITALS — BP 124/66 | HR 57 | Temp 98.0°F | Ht 67.0 in | Wt 146.8 lb

## 2023-10-03 DIAGNOSIS — R10816 Epigastric abdominal tenderness: Secondary | ICD-10-CM

## 2023-10-03 DIAGNOSIS — K5909 Other constipation: Secondary | ICD-10-CM

## 2023-10-03 DIAGNOSIS — R195 Other fecal abnormalities: Secondary | ICD-10-CM | POA: Diagnosis not present

## 2023-10-03 DIAGNOSIS — Z789 Other specified health status: Secondary | ICD-10-CM | POA: Insufficient documentation

## 2023-10-03 DIAGNOSIS — K921 Melena: Secondary | ICD-10-CM | POA: Insufficient documentation

## 2023-10-03 DIAGNOSIS — K5904 Chronic idiopathic constipation: Secondary | ICD-10-CM

## 2023-10-03 NOTE — Progress Notes (Signed)
 Akima Slaugh Faizan Ryliee Figge , M.D. Gastroenterology & Hepatology [facility] Gastroenterology [address] [place], [place] [address] Primary Care Physician: Gladis Mustard, FNP [address] [place] [address]  Chief Complaint: Black stools and diarrhea  History of Present Illness: Sudie Navarette is a 68 y.o. female with hypertension, arthritis who presents for evaluation of black-colored stools, altered bowel movements  Patient reports last week she had multiple episodes of diarrhea with black-colored stools which have now subsided.  Patient has been taking multiple over-the-counter supplements such as beet root and a combination coffee.  Patient usually has baseline constipation bowel movement every 2 to 3 days with taking 4 different stool softener over-the-counter for most of her life  Patient does take significant amount of ibuprofen, she even took 800 mg today because of chronic joint pain  Fortunately patient had a bowel movement today which was formed and brown in stool  The patient denies having any nausea, vomiting, fever, chills, hematochezia,  hematemesis, abdominal distention, abdominal pain,  jaundice, pruritus or weight loss.  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 shin (well  diff)    Past Surgical History: Past Surgical History:  Procedure Laterality Date   BREAST SURGERY Bilateral 02-21-2012   L-300cc,R-325cc   CESAREAN SECTION     INCISION AND DRAINAGE Right 01/16/2013   Procedure: INCISION AND DRAINAGE;  Surgeon: Franky JONELLE Curia, MD;  Location: [facility];  Service: Orthopedics;  Laterality: Right;   TUBAL LIGATION      Family History: Family History  Problem Relation Age of Onset   Hypertension Mother    Hypothyroidism Mother    Cancer Father        ESOPHAGEAL AND BRAIN    Social History: Social History   Tobacco Use  Smoking Status Former   Current packs/day: 0.00   Types: Cigarettes   Start date: 12/19/1979   Quit date: 12/19/1987   Years since quitting: 35.8  Smokeless Tobacco Never   Social History   Substance and Sexual Activity  Alcohol Use Yes   Comment: rare   Social History   Substance and Sexual Activity  Drug Use No    Allergies: Allergies  Allergen Reactions   Codeine    Penicillins    Sulfa Antibiotics     Medications: Current Outpatient Medications  Medication Sig Dispense Refill   ALPRAZolam  (XANAX ) 0.5 MG tablet Take 1 tablet (0.5 mg total) by mouth 2 (two) times daily as needed for anxiety. 60 tablet 5   BIOTIN PO Take by mouth.     Calcium Citrate (CITRACAL PO) Take by mouth.     cetirizine  (ZYRTEC ) 10 MG tablet Take 1 tablet (10 mg total) by mouth daily. 90 tablet 1   cyclobenzaprine  (FLEXERIL ) 10 MG tablet Take 1 tablet (  10 mg total) by mouth 3 (three) times daily as needed for muscle spasms. 30 tablet 0   Docusate Calcium (STOOL SOFTENER PO) Take by mouth. Reported on 03/05/2015     hydrocortisone  (ANUSOL -HC) 25 MG suppository Place 1 suppository (25 mg total) rectally 2 (two) times daily. 12 suppository 0   linaclotide  (LINZESS ) 145 MCG CAPS capsule Take 145 mcg by mouth daily before breakfast.     lisinopril  (ZESTRIL ) 20 MG tablet Take 1 tablet (20 mg total) by mouth daily. 90  tablet 0   Misc Natural Products (BEET ROOT) 500 MG CAPS Take by mouth.     nitroGLYCERIN  (NITROSTAT ) 0.4 MG SL tablet Place 1 tablet (0.4 mg total) under the tongue every 5 (five) minutes as needed for chest pain. 50 tablet 3   omeprazole  (PRILOSEC) 20 MG capsule Take 20 mg by mouth daily.     omeprazole  (PRILOSEC) 20 MG capsule Take 1 capsule (20 mg total) by mouth daily. 90 capsule 1   POTASSIUM CHLORIDE PO Take by mouth.     vitamin C (ASCORBIC ACID) 500 MG tablet Take 500 mg by mouth daily.     No current facility-administered medications for this visit.    Review of Systems: GENERAL: negative for malaise, night sweats HEENT: No changes in hearing or vision, no nose bleeds or other nasal problems. NECK: Negative for lumps, goiter, pain and significant neck swelling RESPIRATORY: Negative for cough, wheezing CARDIOVASCULAR: Negative for chest pain, leg swelling, palpitations, orthopnea GI: SEE HPI MUSCULOSKELETAL: Negative for joint pain or swelling, back pain, and muscle pain. SKIN: Negative for lesions, rash HEMATOLOGY Negative for prolonged bleeding, bruising easily, and swollen nodes. ENDOCRINE: Negative for cold or heat intolerance, polyuria, polydipsia and goiter. NEURO: negative for tremor, gait imbalance, syncope and seizures. The remainder of the review of systems is noncontributory.   Physical Exam: BP 124/66   Pulse (!) 57   Temp 98 F (36.7 C)   Ht 5' 7 (1.702 m)   Wt 146 lb 12.8 oz (66.6 kg)   BMI 22.99 kg/m  GENERAL: The patient is AO x3, in no acute distress. HEENT: Head is normocephalic and atraumatic. EOMI are intact. Mouth is well hydrated and without lesions. NECK: Supple. No masses LUNGS: Clear to auscultation. No presence of rhonchi/wheezing/rales. Adequate chest expansion HEART: RRR, normal s1 and s2. ABDOMEN: Soft, mild epigastric tenderness, no guarding, no peritoneal signs, and nondistended. BS +. No masses.  Imaging/Labs: as above     Latest  Ref Rng & Units 09/25/2023    9:33 AM 08/03/2023   11:10 AM 01/18/2023    9:12 AM  CBC  WBC 3.4 - 10.8 x10E3/uL 10.3  5.0  5.9   Hemoglobin 11.1 - 15.9 g/dL 88.2  86.7  86.0   Hematocrit 34.0 - 46.6 % 36.6  39.2  41.6   Platelets 150 - 450 x10E3/uL 223  300  240    No results found for: IRON, TIBC, FERRITIN  I personally reviewed and interpreted the available labs, imaging and endoscopic files.  Impression and Plan:  Alisa Bobrowski is a 67 y.o. 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 which may have led to melena  Baseline hemoglobin 13 and had 2 gm  drop last hemoglobin 11.7  Will plan on diagnostic upper endoscopy to evaluate the cause of melena  Fortunately patient had formed brown stool today and hence I do not think she is actively bleeding  Stop using high dose aspirin including Goody/BC powders, NSAIDs such as Aleve, ibuprofen, naproxen, Motrin, Voltaren or Advil (even the topical ones)  Can take Tylenol   PPI daily 30 minutes before breakfast  Patient takes significant amount of over-the-counter supplements which I advised against to the patient given risk of drug-induced liver injury and unknown efficacy  Patient has chronic lifelong constipation and takes at least 4 different over-the-counter laxatives to have a bowel movement daily  Previously tried Linzess  145 mcg which caused patient to have significant amount of diarrhea  Linzess  72 mcg samples given if patient improves on it we will prescribe  Ensure adequate fluid intake: Aim for 8 glasses of water daily. Follow a high fiber diet: Include foods such as dates, prunes, pears, and kiwi. Use  Metamucil twice a day.  I thoroughly discussed with the patient the procedure, including the risks involved. Patient understands what the procedure involves including the benefits and any risks. Patient understands alternatives to the proposed procedure. Risks including (but not limited to) bleeding, tearing of the lining (perforation), rupture of adjacent organs, problems with heart and lung function, infection, and medication reactions. A small percentage of complications may require surgery, hospitalization, repeat endoscopic procedure, and/or transfusion.  Patient understood and agreed.    All questions were answered.      Therese Rocco Faizan Berania Peedin, MD Gastroenterology and Hepatology [facility] Gastroenterology   This chart has been completed using [facility] Dictation software, and while attempts have been made to ensure accuracy , certain words and phrases may not be transcribed as intended

## 2023-10-03 NOTE — Patient Instructions (Signed)
 It was very nice to meet you today, as dicussed with will plan for the following :  1)Stop using high dose aspirin including Goody/BC powders, NSAIDs such as Aleve, ibuprofen, naproxen, Motrin, Voltaren or Advil (even the topical ones)   2) Upper endoscopy  3) Ensure adequate fluid intake: Aim for 8 glasses of water daily. Follow a high fiber diet: Include foods such as dates, prunes, pears, and kiwi. Use Metamucil twice a day.  4)Linzess   Linzess  works best when taken once a day every day, on an empty stomach, at least 30 minutes before your first meal of the day.  Diarrhea, nausea or abdominal cramping may occur in the first 2 weeks -keep taking it.  These symptoms should eventually resolve. Please notify us  if having more than 4 watery bowel movements per day or fecal soiling accidents.

## 2023-10-04 ENCOUNTER — Telehealth: Payer: Self-pay | Admitting: *Deleted

## 2023-10-04 NOTE — Telephone Encounter (Signed)
 [facility]  EGD w/Dr.Ahmed, any room

## 2023-10-08 ENCOUNTER — Encounter (INDEPENDENT_AMBULATORY_CARE_PROVIDER_SITE_OTHER): Payer: Self-pay

## 2023-10-08 ENCOUNTER — Telehealth (INDEPENDENT_AMBULATORY_CARE_PROVIDER_SITE_OTHER): Payer: Self-pay | Admitting: Gastroenterology

## 2023-10-08 MED ORDER — LINACLOTIDE 72 MCG PO CAPS: 72.0000 ug | ORAL_CAPSULE | Freq: Every day | ORAL | 1 refills | Status: DC

## 2023-10-08 NOTE — Addendum Note (Signed)
 Addended by: CINDERELLA DEATRICE SMILES on: 10/08/2023 03:30 PM   Modules accepted: Orders

## 2023-10-08 NOTE — Telephone Encounter (Signed)
 Pt would like to use [facility]. Please advise. Thank you

## 2023-10-08 NOTE — Telephone Encounter (Signed)
 Pt left voicemail asking is Dr.Ahmed would send refill of Linzess  72 mg to pharmacy. Returned call back to pt to ask what pharmacy but had to leave message. Sent my chart message to pt asking what pharmacy. Pt states Linzess  is working really well for her.

## 2023-10-09 ENCOUNTER — Telehealth (INDEPENDENT_AMBULATORY_CARE_PROVIDER_SITE_OTHER): Payer: Self-pay | Admitting: Gastroenterology

## 2023-10-09 NOTE — Telephone Encounter (Signed)
 Pt left voicemail in regards to Dr.Ahmed calling in Linzess  for her yesterday Returned call to pt and left message that Dr.Ahmed did call in Linzess  to [facility] and pharmacy received it at 3:30pm yesterday.

## 2023-10-14 ENCOUNTER — Other Ambulatory Visit: Payer: Self-pay | Admitting: Nurse Practitioner

## 2023-10-14 DIAGNOSIS — I10 Essential (primary) hypertension: Secondary | ICD-10-CM

## 2023-10-17 ENCOUNTER — Telehealth: Payer: Self-pay | Admitting: *Deleted

## 2023-10-17 NOTE — Telephone Encounter (Signed)
 Called pt. Advised she did not have to pay upfront. She stated she still did not want to reschedule at this time. If she changes her mind she will call

## 2023-10-17 NOTE — Telephone Encounter (Signed)
-----   Message from Elveria GRADE sent at 10/17/2023  3:55 PM EDT ----- This patient LM to cancel her procedure for Friday because she was told she would have to pay 395.00 out of pocket and she can't do that.  I have canceled it.

## 2023-10-17 NOTE — Telephone Encounter (Signed)
 Pt returned call. Pt scheduled for 10/19/23 at 9:45am. Instructions sent via my chart. Also went over instructions on phone with patient.

## 2023-10-18 ENCOUNTER — Inpatient Hospital Stay (HOSPITAL_COMMUNITY): Admission: RE | Admit: 2023-10-18 | Source: Ambulatory Visit

## 2023-10-19 ENCOUNTER — Encounter (HOSPITAL_COMMUNITY): Admission: RE | Payer: Self-pay | Source: Home / Self Care

## 2023-10-19 ENCOUNTER — Ambulatory Visit (HOSPITAL_COMMUNITY): Admission: RE | Admit: 2023-10-19 | Source: Home / Self Care | Admitting: Gastroenterology

## 2023-10-19 SURGERY — EGD (ESOPHAGOGASTRODUODENOSCOPY)
Anesthesia: Choice

## 2023-11-05 ENCOUNTER — Other Ambulatory Visit: Payer: Self-pay | Admitting: Nurse Practitioner

## 2023-11-05 DIAGNOSIS — I10 Essential (primary) hypertension: Secondary | ICD-10-CM

## 2023-12-13 DIAGNOSIS — H5203 Hypermetropia, bilateral: Secondary | ICD-10-CM | POA: Diagnosis not present

## 2024-01-28 ENCOUNTER — Ambulatory Visit: Payer: Self-pay | Admitting: Nurse Practitioner

## 2024-01-28 ENCOUNTER — Encounter: Payer: Self-pay | Admitting: Nurse Practitioner

## 2024-01-28 VITALS — BP 123/69 | HR 60 | Temp 97.6°F | Ht 67.0 in | Wt 148.0 lb

## 2024-01-28 DIAGNOSIS — K5904 Chronic idiopathic constipation: Secondary | ICD-10-CM | POA: Diagnosis not present

## 2024-01-28 DIAGNOSIS — K640 First degree hemorrhoids: Secondary | ICD-10-CM

## 2024-01-28 DIAGNOSIS — F411 Generalized anxiety disorder: Secondary | ICD-10-CM

## 2024-01-28 DIAGNOSIS — K219 Gastro-esophageal reflux disease without esophagitis: Secondary | ICD-10-CM | POA: Diagnosis not present

## 2024-01-28 DIAGNOSIS — I10 Essential (primary) hypertension: Secondary | ICD-10-CM

## 2024-01-28 LAB — LIPID PANEL

## 2024-01-28 MED ORDER — ALPRAZOLAM 0.5 MG PO TABS: 0.5000 mg | ORAL_TABLET | Freq: Two times a day (BID) | ORAL | 5 refills | Status: AC | PRN

## 2024-01-28 MED ORDER — OMEPRAZOLE 20 MG PO CPDR: 20.0000 mg | DELAYED_RELEASE_CAPSULE | Freq: Every day | ORAL | 1 refills | Status: AC

## 2024-01-28 MED ORDER — LISINOPRIL 20 MG PO TABS: 20.0000 mg | ORAL_TABLET | Freq: Every day | ORAL | 1 refills | Status: AC

## 2024-01-28 NOTE — Patient Instructions (Signed)

## 2024-01-28 NOTE — Progress Notes (Signed)
 Subjective:    Patient ID: Elizabeth Nash, female    DOB: 08/08/1955, 68 y.o.   MRN: [id number]   Chief Complaint: No chief complaint on file.    HPI:  Elizabeth Nash is a 68 y.o. who identifies as a female who was assigned female at birth.   Social history: Lives with: her daughter and grandkids live with her Work history: retired   Water Engineer in today for follow up of the following chronic medical issues:  1. Primary hypertension No c/o chest pain, sob or headache. Doe snot check blood pressure at home. BP Readings from Last 3 Encounters:  01/28/24 123/69  10/03/23 124/66  09/25/23 122/75     2. Gastroesophageal reflux disease without esophagitis Is on omperazole and is doing well.  3. Chronic idiopathic constipation Has chronic constipation. Is due for colonoscopy in May. GI has been on linzess  and that has really helped her. She has been having daily  bowel movements. Has follow up in 2 week.  4. GAD (generalized anxiety disorder) Id on xanax  BID and is doing well.     01/28/2024   10:36 AM 01/18/2023    8:39 AM 11/23/2022    9:49 AM 05/23/2022    9:17 AM  GAD 7 : Generalized Anxiety Score  Nervous, Anxious, on Edge 0 1 0 0  Control/stop worrying 0 0 0 0  Worry too much - different things 0 0 0 0  Trouble relaxing 0 0 0 0  Restless 0 0 0 0  Easily annoyed or irritable 0 0 0 0  Afraid - awful might happen 0 0 0 0  Total GAD 7 Score 0 1 0 0  Anxiety Difficulty Not difficult at all Not difficult at all Not difficult at all Not difficult at all      5. Grade I hemorrhoids Better since her constipation is better.    New complaints: None today  Allergies  Allergen Reactions   Codeine    Penicillins    Sulfa Antibiotics    Outpatient Encounter Medications as of 01/28/2024  Medication Sig   ALPRAZolam  (XANAX ) 0.5 MG tablet Take 1 tablet (0.5 mg total) by mouth 2 (two) times daily as needed for anxiety.   BIOTIN PO Take by mouth.   Calcium Citrate (CITRACAL PO)  Take by mouth.   cetirizine  (ZYRTEC ) 10 MG tablet Take 1 tablet (10 mg total) by mouth daily.   cyclobenzaprine  (FLEXERIL ) 10 MG tablet Take 1 tablet (10 mg total) by mouth 3 (three) times daily as needed for muscle spasms.   Docusate Calcium (STOOL SOFTENER PO) Take by mouth. Reported on 03/05/2015   hydrocortisone  (ANUSOL -HC) 25 MG suppository Place 1 suppository (25 mg total) rectally 2 (two) times daily.   linaclotide  (LINZESS ) 72 MCG capsule Take 1 capsule (72 mcg total) by mouth daily.   lisinopril  (ZESTRIL ) 20 MG tablet TAKE 1 TABLET BY MOUTH EVERY DAY   Misc Natural Products (BEET ROOT) 500 MG CAPS Take by mouth.   nitroGLYCERIN  (NITROSTAT ) 0.4 MG SL tablet Place 1 tablet (0.4 mg total) under the tongue every 5 (five) minutes as needed for chest pain.   omeprazole  (PRILOSEC) 20 MG capsule Take 20 mg by mouth daily.   omeprazole  (PRILOSEC) 20 MG capsule Take 1 capsule (20 mg total) by mouth daily.   POTASSIUM CHLORIDE PO Take by mouth.   vitamin C (ASCORBIC ACID) 500 MG tablet Take 500 mg by mouth daily.   No facility-administered encounter medications on file as of 01/28/2024.  Past Surgical History:  Procedure Laterality Date   BREAST SURGERY Bilateral 02-21-2012   L-300cc,R-325cc   CESAREAN SECTION     INCISION AND DRAINAGE Right 01/16/2013   Procedure: INCISION AND DRAINAGE;  Surgeon: Franky JONELLE Curia, MD;  Location: [facility];  Service: Orthopedics;  Laterality: Right;   TUBAL LIGATION      Family History  Problem Relation Age of Onset   Hypertension Mother    Hypothyroidism Mother    Cancer Father        ESOPHAGEAL AND BRAIN      Controlled substance contract: 01/28/24     Review of Systems  Constitutional:  Negative for diaphoresis.  Eyes:  Negative for pain.  Respiratory:  Negative for shortness of breath.   Cardiovascular:  Negative for chest pain, palpitations and leg swelling.  Gastrointestinal:  Negative for abdominal pain.  Endocrine:  Negative for polydipsia.  Skin:  Negative for rash.  Neurological:  Negative for dizziness, weakness and headaches.  Hematological:  Does not bruise/bleed easily.  All other systems reviewed and are negative.      Objective:   Physical Exam Constitutional:      Appearance: Normal appearance.  Cardiovascular:     Rate and Rhythm: Normal rate and regular rhythm.     Heart sounds: Normal heart sounds.  Pulmonary:     Effort: Pulmonary effort is normal.     Breath sounds: Normal breath sounds.  Skin:    General: Skin is warm.  Neurological:     General: No focal deficit present.     Mental Status: She is alert and oriented to person, place, and time.  Psychiatric:        Mood and Affect: Mood normal.        Behavior: Behavior normal.    BP 123/69   Pulse 60   Temp 97.6 F (36.4 C) (Temporal)   Ht 5' 7 (1.702 m)   Wt 148 lb (67.1 kg)   SpO2 95%   BMI 23.18 kg/m         Assessment & Plan:  Elizabeth Nash comes in today with chief complaint of Medical Management of Chronic Issues   Diagnosis and orders addressed:  1. Primary hypertension (Primary) Low sodium diet - lisinopril  (ZESTRIL ) 20 MG tablet; Take 1 tablet (20 mg total) by mouth daily.  Dispense: 90 tablet; Refill: 1  2. Gastroesophageal reflux disease without esophagitis Avoid spicy foods Do not eat 2 hours prior to bedtime  - omeprazole  (PRILOSEC) 20 MG capsule; Take 1 capsule (20 mg total) by mouth daily.  Dispense: 90 capsule; Refill: 1  3. Chronic idiopathic constipation Continue linzess  Keep follow up appt with GI  4. GAD (generalized anxiety disorder) Stress management - ToxASSURE Select 13 (MW), Urine - ALPRAZolam  (XANAX ) 0.5 MG tablet; Take 1 tablet (0.5 mg total) by mouth 2 (two) times daily as needed for anxiety.  Dispense: 60 tablet; Refill: 5  5. Grade I hemorrhoids Increase fiber in diet   Labs pending Health Maintenance reviewed Diet and exercise encouraged  Follow up plan: 6  months   Mary-Margaret Gladis, FNP

## 2024-01-29 ENCOUNTER — Ambulatory Visit: Payer: Self-pay | Admitting: Nurse Practitioner

## 2024-01-29 LAB — CBC WITH DIFFERENTIAL/PLATELET
Basophils Absolute: 0 x10E3/uL (ref 0.0–0.2)
Basos: 1 %
EOS (ABSOLUTE): 0.2 x10E3/uL (ref 0.0–0.4)
Eos: 3 %
Hematocrit: 35 % (ref 34.0–46.6)
Hemoglobin: 11.5 g/dL (ref 11.1–15.9)
Immature Grans (Abs): 0 x10E3/uL (ref 0.0–0.1)
Immature Granulocytes: 0 %
Lymphocytes Absolute: 2.2 x10E3/uL (ref 0.7–3.1)
Lymphs: 37 %
MCH: 31.8 pg (ref 26.6–33.0)
MCHC: 32.9 g/dL (ref 31.5–35.7)
MCV: 97 fL (ref 79–97)
Monocytes Absolute: 0.5 x10E3/uL (ref 0.1–0.9)
Monocytes: 8 %
Neutrophils Absolute: 3.1 x10E3/uL (ref 1.4–7.0)
Neutrophils: 51 %
Platelets: 248 x10E3/uL (ref 150–450)
RBC: 3.62 x10E6/uL — ABNORMAL LOW (ref 3.77–5.28)
RDW: 12.5 % (ref 11.7–15.4)
WBC: 6 x10E3/uL (ref 3.4–10.8)

## 2024-01-29 LAB — LIPID PANEL
Cholesterol, Total: 203 mg/dL — AB (ref 100–199)
HDL: 87 mg/dL (ref >39)
LDL CALC COMMENT:: 2.3 ratio (ref 0.0–4.4)
LDL Chol Calc (NIH): 101 mg/dL — AB (ref 0–99)
Triglycerides: 86 mg/dL (ref 0–149)
VLDL Cholesterol Cal: 15 mg/dL (ref 5–40)

## 2024-01-29 LAB — CMP14+EGFR
ALT: 24 IU/L (ref 0–32)
AST: 18 IU/L (ref 0–40)
Albumin: 4.4 g/dL (ref 3.9–4.9)
Alkaline Phosphatase: 59 IU/L (ref 49–135)
BUN/Creatinine Ratio: 25 (ref 12–28)
BUN: 16 mg/dL (ref 8–27)
Bilirubin Total: 0.5 mg/dL (ref 0.0–1.2)
CO2: 23 mmol/L (ref 20–29)
Calcium: 9.3 mg/dL (ref 8.7–10.3)
Chloride: 102 mmol/L (ref 96–106)
Creatinine, Ser: 0.65 mg/dL (ref 0.57–1.00)
Globulin, Total: 2.4 g/dL (ref 1.5–4.5)
Glucose: 96 mg/dL (ref 70–99)
Potassium: 4.4 mmol/L (ref 3.5–5.2)
Sodium: 139 mmol/L (ref 134–144)
Total Protein: 6.8 g/dL (ref 6.0–8.5)
eGFR: 97 mL/min/1.73 (ref >59)

## 2024-02-01 LAB — TOXASSURE SELECT 13 (MW), URINE

## 2024-02-06 ENCOUNTER — Encounter (INDEPENDENT_AMBULATORY_CARE_PROVIDER_SITE_OTHER): Payer: Self-pay | Admitting: Gastroenterology

## 2024-02-06 ENCOUNTER — Ambulatory Visit (INDEPENDENT_AMBULATORY_CARE_PROVIDER_SITE_OTHER): Admitting: Gastroenterology

## 2024-02-06 VITALS — BP 109/66 | HR 60 | Temp 97.1°F | Ht 67.0 in | Wt 152.2 lb

## 2024-02-06 DIAGNOSIS — Z789 Other specified health status: Secondary | ICD-10-CM | POA: Diagnosis not present

## 2024-02-06 DIAGNOSIS — K5904 Chronic idiopathic constipation: Secondary | ICD-10-CM

## 2024-02-06 DIAGNOSIS — K921 Melena: Secondary | ICD-10-CM

## 2024-02-06 NOTE — Patient Instructions (Signed)
 It was very nice to meet you today, as dicussed with will plan for the following :  1) Gas-X and BEANo  2) Continue Linzess   3) if you have symptoms again please let us  know

## 2024-02-06 NOTE — Progress Notes (Signed)
 Meela Wareing Faizan Kinda Pottle , M.D. Gastroenterology & Hepatology [facility] [address] [place], [place] [address] Primary Care Physician: Gladis Mustard, FNP [address] [place] [address]  Chief Complaint: Black stools and diarrhea  History of Present Illness: Elizabeth Nash is a 68 y.o. female with hypertension, arthritis who presents for evaluation of black-colored stools, altered bowel movements  Patient was last seen 09/2023 for concerns of black-colored stools and epigastric discomfort where she was scheduled for upper endoscopy which patient later canceled  Patient reports that she does not have any further complaints.  After starting Linzess  72 mcg her constipation is much improved.  Patient reports that black-colored stool previously seen was likely because she was drinking beet juice and mushroom coffee.  She is not interested in further evaluation with upper endoscopy and CT scan at this time.  Patient reports her stools are brown now no abdominal pain and is rather gaining weight  Previous history: Had multiple episodes of diarrhea with black-colored stools which have now subsided.  Patient has been taking multiple over-the-counter supplements such as beet root and a combination coffee. Patient usually has baseline constipation bowel movement every 2 to 3 days with taking 4 different stool softener over-the-counter for most of her life. Patient does take significant amount of ibuprofen, she even took 800 mg today because of chronic joint pain  Hemoglobin 11.7 normal BUN/creatinine ratio FOBT negative  Last ZHI:wnwz Last Colonoscopy:2021 [facility] , unable to open report as per patient she is next due May 2026  FHx: neg for any gastrointestinal/liver disease, no malignancies Social: neg smoking, alcohol or illicit drug use Surgical: C-section  Past Medical History: Past Medical History:  Diagnosis Date   Anxiety     Arthritis    Atypical nevus 01/18/2001   left hip post outer (moderate)   Atypical nevus 05/14/2003   right tricep (mild)   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 shin (well diff)    Past Surgical History: Past Surgical History:  Procedure Laterality Date   BREAST SURGERY Bilateral 02-21-2012   L-300cc,R-325cc   CESAREAN SECTION     INCISION AND DRAINAGE Right 01/16/2013   Procedure: INCISION AND DRAINAGE;  Surgeon: Franky JONELLE Curia, MD;  Location: [facility];  Service: Orthopedics;  Laterality: Right;   TUBAL LIGATION      Family History: Family History  Problem Relation Age of Onset   Hypertension Mother    Hypothyroidism Mother    Cancer Father        ESOPHAGEAL AND BRAIN    Social History: Social History   Tobacco Use  Smoking Status Former   Current packs/day: 0.00   Types: Cigarettes   Start date: 12/19/1979   Quit date: 12/19/1987   Years since quitting: 36.1  Smokeless Tobacco Never   Social History   Substance and Sexual Activity  Alcohol Use Yes   Comment: rare   Social History   Substance and Sexual Activity  Drug Use No    Allergies: Allergies  Allergen Reactions   Codeine    Penicillins    Sulfa Antibiotics     Medications: Current Outpatient Medications  Medication Sig Dispense Refill   ALPRAZolam  (XANAX ) 0.5 MG tablet Take 1 tablet (0.5 mg total) by mouth 2 (two) times daily  as needed for anxiety. 60 tablet 5   BIOTIN PO Take by mouth.     Calcium Citrate (CITRACAL PO) Take by mouth.     cetirizine  (ZYRTEC ) 10 MG tablet Take 1 tablet (10 mg total) by mouth daily. 90 tablet 1   cyclobenzaprine  (FLEXERIL ) 10 MG tablet Take 1 tablet (10 mg total) by mouth 3 (three) times daily as needed for muscle spasms. 30 tablet 0   hydrocortisone  (ANUSOL -HC) 25 MG suppository  Place 1 suppository (25 mg total) rectally 2 (two) times daily. 12 suppository 0   linaclotide  (LINZESS ) 72 MCG capsule Take 1 capsule (72 mcg total) by mouth daily. 90 capsule 1   lisinopril  (ZESTRIL ) 20 MG tablet Take 1 tablet (20 mg total) by mouth daily. 90 tablet 1   nitroGLYCERIN  (NITROSTAT ) 0.4 MG SL tablet Place 1 tablet (0.4 mg total) under the tongue every 5 (five) minutes as needed for chest pain. 50 tablet 3   omeprazole  (PRILOSEC) 20 MG capsule Take 1 capsule (20 mg total) by mouth daily. 90 capsule 1   POTASSIUM CHLORIDE PO Take by mouth.     vitamin C (ASCORBIC ACID) 500 MG tablet Take 500 mg by mouth daily.     No current facility-administered medications for this visit.    Review of Systems: GENERAL: negative for malaise, night sweats HEENT: No changes in hearing or vision, no nose bleeds or other nasal problems. NECK: Negative for lumps, goiter, pain and significant neck swelling RESPIRATORY: Negative for cough, wheezing CARDIOVASCULAR: Negative for chest pain, leg swelling, palpitations, orthopnea GI: SEE HPI MUSCULOSKELETAL: Negative for joint pain or swelling, back pain, and muscle pain. SKIN: Negative for lesions, rash HEMATOLOGY Negative for prolonged bleeding, bruising easily, and swollen nodes. ENDOCRINE: Negative for cold or heat intolerance, polyuria, polydipsia and goiter. NEURO: negative for tremor, gait imbalance, syncope and seizures. The remainder of the review of systems is noncontributory.   Physical Exam: BP 109/66   Pulse 60   Temp (!) 97.1 F (36.2 C)   Ht 5' 7 (1.702 m)   Wt 152 lb 3.2 oz (69 kg)   BMI 23.84 kg/m  GENERAL: The patient is AO x3, in no acute distress. HEENT: Head is normocephalic and atraumatic. EOMI are intact. Mouth is well hydrated and without lesions. NECK: Supple. No masses LUNGS: Clear to auscultation. No presence of rhonchi/wheezing/rales. Adequate chest expansion HEART: RRR, normal s1 and s2. ABDOMEN: Soft, mild  epigastric tenderness, no guarding, no peritoneal signs, and nondistended. BS +. No masses.  Imaging/Labs: as above     Latest Ref Rng & Units 01/28/2024   11:15 AM 09/25/2023    9:33 AM 08/03/2023   11:10 AM  CBC  WBC 3.4 - 10.8 x10E3/uL 6.0  10.3  5.0   Hemoglobin 11.1 - 15.9 g/dL 88.4  88.2  86.7   Hematocrit 34.0 - 46.6 % 35.0  36.6  39.2   Platelets 150 - 450 x10E3/uL 248  223  300    No results found for: IRON, TIBC, FERRITIN  I personally reviewed and interpreted the available labs, imaging and endoscopic files.  Impression and Plan:  Elizabeth Nash is a 68 y.o. 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 Baseline hemoglobin 13 and  had 2 gm drop last hemoglobin 11.7  Patient was scheduled for upper endoscopy which I recommended to rule out peptic ulcer disease and malignancy, but she later canceled as she is concerned about insurance coverage.  Patient is not interested in any upper endoscopy or CT scan she reports her symptoms have resolved, with brown stool now and no further abdominal pain  Previously tried Linzess  145 mcg which caused patient to have significant amount of diarrhea.  Doing very well on Linzess  72 mcg   Ensure adequate fluid intake: Aim for 8 glasses of water daily. Follow a high fiber diet: Include foods such as dates, prunes, pears, and kiwi. Use Metamucil twice a day.  I advised patient to reach out back to us  if she changes her mind or if she has any further symptoms  Colonoscopy next year May 2026   All questions were answered.      Elizabeth Sackrider Faizan Freada Twersky, MD Gastroenterology and Hepatology [facility]astroenterology   This  chart has been completed using [facility] Dictation software, and while attempts have been made to ensure accuracy , certain words and phrases may not be transcribed as intended

## 2024-04-03 ENCOUNTER — Telehealth (INDEPENDENT_AMBULATORY_CARE_PROVIDER_SITE_OTHER): Payer: Self-pay | Admitting: Gastroenterology

## 2024-04-03 ENCOUNTER — Other Ambulatory Visit (INDEPENDENT_AMBULATORY_CARE_PROVIDER_SITE_OTHER): Payer: Self-pay

## 2024-04-03 MED ORDER — LINACLOTIDE 72 MCG PO CAPS: 72.0000 ug | ORAL_CAPSULE | Freq: Every day | ORAL | 1 refills | Status: AC

## 2024-04-03 NOTE — Telephone Encounter (Signed)
 Patient left voicemail stating she is needing a refill on Linzess . [facility] and [facility] her.  Returned call to patient, confirmed she was still taking 72 mcg. Pt stated she was. [facility]ill [facility] to [facility]

## 2024-07-29 ENCOUNTER — Ambulatory Visit: Admitting: Nurse Practitioner
